# Patient Record
Sex: Female | Born: 1975 | Race: Black or African American | Hispanic: No | Marital: Single | Smoking: Current every day smoker
Health system: Southern US, Community
[De-identification: ages and names within clinical notes are randomized; demographics above are authoritative.]

## PROBLEM LIST (undated history)

## (undated) ENCOUNTER — Inpatient Hospital Stay (HOSPITAL_COMMUNITY): Payer: Self-pay

## (undated) DIAGNOSIS — A549 Gonococcal infection, unspecified: Secondary | ICD-10-CM

## (undated) DIAGNOSIS — IMO0002 Reserved for concepts with insufficient information to code with codable children: Secondary | ICD-10-CM

## (undated) DIAGNOSIS — N390 Urinary tract infection, site not specified: Secondary | ICD-10-CM

## (undated) DIAGNOSIS — A749 Chlamydial infection, unspecified: Secondary | ICD-10-CM

## (undated) HISTORY — PX: THERAPEUTIC ABORTION: SHX798

---

## 1998-04-22 ENCOUNTER — Emergency Department (HOSPITAL_COMMUNITY): Admission: EM | Admit: 1998-04-22 | Discharge: 1998-04-22 | Payer: Self-pay | Admitting: Emergency Medicine

## 1999-03-27 ENCOUNTER — Emergency Department (HOSPITAL_COMMUNITY): Admission: EM | Admit: 1999-03-27 | Discharge: 1999-03-27 | Payer: Self-pay | Admitting: Emergency Medicine

## 1999-05-12 ENCOUNTER — Other Ambulatory Visit: Admission: RE | Admit: 1999-05-12 | Discharge: 1999-05-12 | Payer: Self-pay | Admitting: Obstetrics

## 2002-09-06 ENCOUNTER — Emergency Department (HOSPITAL_COMMUNITY): Admission: EM | Admit: 2002-09-06 | Discharge: 2002-09-06 | Payer: Self-pay | Admitting: Emergency Medicine

## 2007-10-21 ENCOUNTER — Inpatient Hospital Stay (HOSPITAL_COMMUNITY): Admission: AD | Admit: 2007-10-21 | Discharge: 2007-10-21 | Payer: Self-pay | Admitting: Obstetrics and Gynecology

## 2008-07-14 ENCOUNTER — Emergency Department (HOSPITAL_COMMUNITY): Admission: EM | Admit: 2008-07-14 | Discharge: 2008-07-14 | Payer: Self-pay | Admitting: Emergency Medicine

## 2010-01-20 ENCOUNTER — Emergency Department (HOSPITAL_COMMUNITY): Admission: EM | Admit: 2010-01-20 | Discharge: 2010-01-20 | Payer: Self-pay | Admitting: Family Medicine

## 2010-11-14 LAB — POCT URINALYSIS DIP (DEVICE)
Specific Gravity, Urine: 1.025 (ref 1.005–1.030)
pH: 6 (ref 5.0–8.0)

## 2010-11-14 LAB — URINE CULTURE: Colony Count: >=100000

## 2011-05-22 LAB — WET PREP, GENITAL: Yeast Wet Prep HPF POC: NONE SEEN

## 2011-05-22 LAB — BASIC METABOLIC PANEL
GFR calc non Af Amer: >60
Potassium: 3.4 — ABNORMAL LOW
Sodium: 138

## 2011-05-22 LAB — CBC
HCT: 36.8
Hemoglobin: 12.5
WBC: 11.2 — ABNORMAL HIGH

## 2011-05-22 LAB — URINE MICROSCOPIC-ADD ON

## 2011-05-22 LAB — URINALYSIS, ROUTINE W REFLEX MICROSCOPIC
Bilirubin Urine: NEGATIVE
Glucose, UA: 250 — AB
Ketones, ur: 15 — AB
Nitrite: POSITIVE — AB
pH: 5

## 2011-05-22 LAB — DIFFERENTIAL
Eosinophils Relative: 1
Lymphocytes Relative: 26
Lymphs Abs: 2.9
Monocytes Absolute: 0.8
Monocytes Relative: 7

## 2011-05-30 LAB — URINALYSIS, ROUTINE W REFLEX MICROSCOPIC
Glucose, UA: NEGATIVE
Ketones, ur: NEGATIVE
Protein, ur: NEGATIVE

## 2011-05-30 LAB — URINE CULTURE: Colony Count: >=100000

## 2011-05-30 LAB — URINE MICROSCOPIC-ADD ON

## 2011-07-17 ENCOUNTER — Inpatient Hospital Stay (HOSPITAL_COMMUNITY): Payer: Medicaid Other

## 2011-07-17 ENCOUNTER — Inpatient Hospital Stay (HOSPITAL_COMMUNITY)
Admission: AD | Admit: 2011-07-17 | Discharge: 2011-07-17 | Disposition: A | Discharge status: Home / Self Care | Payer: Medicaid Other | Source: Ambulatory Visit | Attending: Obstetrics & Gynecology | Admitting: Obstetrics & Gynecology

## 2011-07-17 ENCOUNTER — Encounter (HOSPITAL_COMMUNITY): Payer: Self-pay | Admitting: *Deleted

## 2011-07-17 DIAGNOSIS — Z3490 Encounter for supervision of normal pregnancy, unspecified, unspecified trimester: Secondary | ICD-10-CM

## 2011-07-17 DIAGNOSIS — Z1389 Encounter for screening for other disorder: Secondary | ICD-10-CM

## 2011-07-17 DIAGNOSIS — R1031 Right lower quadrant pain: Secondary | ICD-10-CM | POA: Insufficient documentation

## 2011-07-17 DIAGNOSIS — A5901 Trichomonal vulvovaginitis: Secondary | ICD-10-CM

## 2011-07-17 DIAGNOSIS — O98819 Other maternal infectious and parasitic diseases complicating pregnancy, unspecified trimester: Secondary | ICD-10-CM | POA: Insufficient documentation

## 2011-07-17 HISTORY — DX: Chlamydial infection, unspecified: A74.9

## 2011-07-17 HISTORY — DX: Reserved for concepts with insufficient information to code with codable children: IMO0002

## 2011-07-17 HISTORY — DX: Gonococcal infection, unspecified: A54.9

## 2011-07-17 HISTORY — DX: Urinary tract infection, site not specified: N39.0

## 2011-07-17 LAB — CBC
Hemoglobin: 12.4 g/dL (ref 12.0–15.0)
RBC: 4.42 MIL/uL (ref 3.87–5.11)
WBC: 10.7 10*3/uL — ABNORMAL HIGH (ref 4.0–10.5)

## 2011-07-17 LAB — URINALYSIS, ROUTINE W REFLEX MICROSCOPIC
Bilirubin Urine: NEGATIVE
Glucose, UA: NEGATIVE mg/dL
Hgb urine dipstick: NEGATIVE
Ketones, ur: NEGATIVE mg/dL
Protein, ur: NEGATIVE mg/dL

## 2011-07-17 LAB — URINE MICROSCOPIC-ADD ON

## 2011-07-17 LAB — ABO/RH: ABO/RH(D): O POS

## 2011-07-17 LAB — WET PREP, GENITAL
Clue Cells Wet Prep HPF POC: NONE SEEN
Yeast Wet Prep HPF POC: NONE SEEN

## 2011-07-17 LAB — HCG, QUANTITATIVE, PREGNANCY: hCG, Beta Chain, Quant, S: 14161 m[IU]/mL — ABNORMAL HIGH (ref <5)

## 2011-07-17 MED ORDER — METRONIDAZOLE 500 MG PO TABS: 500.0000 mg | ORAL_TABLET | Freq: Two times a day (BID) | ORAL | Status: DC

## 2011-07-17 NOTE — Progress Notes (Signed)
Pain in low abd and low back, mainly RLQ- started over weekend.

## 2011-07-17 NOTE — ED Provider Notes (Signed)
History     Chief Complaint  Patient presents with  . Abdominal Pain  . Back Pain   HPICharlette N Simmons is 35 y.o. [id number] unknown weeks presenting with RLQ and low back pain that started 3 days ago. The pain came on suddenly and feels like a cramping. It comes and goes and is worse when she is laying on her right side. She states it is similar to the pain she has had with kidney infections, but denies frequency/urgency. No constipation or diarrhea.  She had a positive home preg test last week. LMP 10/1.  Did not have pain during her other pregnancies.  Denies vaginal bleeding.    Past Medical History  Diagnosis Date  . Urinary tract infection   . Abnormal Pap smear     cryo, rpt wnl  . Chlamydia   . Gonorrhea     Past Surgical History  Procedure Date  . No past surgeries     Family History  Problem Relation Age of Onset  . Anesthesia problems Neg Hx     History  Substance Use Topics  . Smoking status: Current Everyday Smoker -- 0.2 packs/day for 10 years  . Smokeless tobacco: Not on file   Comment: cutting back  . Alcohol Use: No    Allergies: No Known Allergies  Prescriptions prior to admission  Medication Sig Dispense Refill  . ALPRAZolam (XANAX) 0.25 MG tablet Take 0.25 mg by mouth once. For anxiety         Review of Systems  HENT: Negative for congestion and sore throat.   Eyes: Negative for blurred vision and double vision.  Respiratory: Negative for cough.   Gastrointestinal: Negative for nausea, vomiting, abdominal pain, diarrhea and constipation.  Genitourinary: Negative for urgency and frequency.  Musculoskeletal: Positive for back pain.  Neurological: Negative for dizziness and headaches.   Physical Exam   Blood pressure 127/69, pulse 83, temperature 98.7 F (37.1 C), temperature source Oral, resp. rate 16, height 5\' 5"  (1.651 m), weight 157 lb 9.6 oz (71.487 kg), last menstrual period 05/31/2011, SpO2 99.00%.  Physical Exam    Constitutional: She is oriented to person, place, and time. She appears well-developed and well-nourished.  HENT:  Head: Normocephalic and atraumatic.  Nose: Nose normal.  Mouth/Throat: Oropharynx is clear and moist.  Eyes: EOM are normal.  Neck: Normal range of motion.  Cardiovascular: Normal rate, regular rhythm, normal heart sounds and intact distal pulses.   Respiratory: Effort normal and breath sounds normal.  GI: Soft. Bowel sounds are normal. She exhibits no distension. There is tenderness. There is guarding. There is no rebound.       Tender to palpation RLQ and suprapubic. Intraluminal gas palpated in RLQ.  Genitourinary: Vagina normal.       Uterus soft, enlarged.   Musculoskeletal: Normal range of motion. She exhibits no edema and no tenderness.  Neurological: She is alert and oriented to person, place, and time.  Skin: Skin is warm and dry.  Psychiatric: She has a normal mood and affect.   Results for orders placed during the hospital encounter of 07/17/11 (from the past 24 hour(s))  URINALYSIS, ROUTINE W REFLEX MICROSCOPIC     Status: Abnormal   Collection Time   07/17/11  2:35 PM      Component Value Range   Color, Urine YELLOW  YELLOW    Appearance CLEAR  CLEAR    Specific Gravity, Urine >1.030 (*) 1.005 - 1.030    pH 5.5  5.0 -  8.0    Glucose, UA NEGATIVE  NEGATIVE (mg/dL)   Hgb urine dipstick NEGATIVE  NEGATIVE    Bilirubin Urine NEGATIVE  NEGATIVE    Ketones, ur NEGATIVE  NEGATIVE (mg/dL)   Protein, ur NEGATIVE  NEGATIVE (mg/dL)   Urobilinogen, UA 0.2  0.0 - 1.0 (mg/dL)   Nitrite NEGATIVE  NEGATIVE    Leukocytes, UA TRACE (*) NEGATIVE   URINE MICROSCOPIC-ADD ON     Status: Abnormal   Collection Time   07/17/11  2:35 PM      Component Value Range   Squamous Epithelial / LPF FEW (*) RARE    WBC, UA 0-2  <3 (WBC/hpf)   RBC / HPF 0-2  <3 (RBC/hpf)   Bacteria, UA FEW (*) RARE   POCT PREGNANCY, URINE     Status: Normal   Collection Time   07/17/11  2:41 PM       Component Value Range   Preg Test, Ur POSITIVE    CBC     Status: Abnormal   Collection Time   07/17/11  3:59 PM      Component Value Range   WBC 10.7 (*) 4.0 - 10.5 (K/uL)   RBC 4.42  3.87 - 5.11 (MIL/uL)   Hemoglobin 12.4  12.0 - 15.0 (g/dL)   HCT 16.1  09.6 - 04.5 (%)   MCV 83.3  78.0 - 100.0 (fL)   MCH 28.1  26.0 - 34.0 (pg)   MCHC 33.7  30.0 - 36.0 (g/dL)   RDW 40.9  81.1 - 91.4 (%)   Platelets 249  150 - 400 (K/uL)  HCG, QUANTITATIVE, PREGNANCY     Status: Abnormal   Collection Time   07/17/11  3:59 PM      Component Value Range   hCG, Beta Chain, Quant, S 14161 (*) <5 (mIU/mL)  ABO/RH     Status: Normal   Collection Time   07/17/11  3:59 PM      Component Value Range   ABO/RH(D) O POS    WET PREP, GENITAL     Status: Abnormal   Collection Time   07/17/11  4:50 PM      Component Value Range   Yeast, Wet Prep NONE SEEN  NONE SEEN    Trich, Wet Prep FEW (*) NONE SEEN    Clue Cells, Wet Prep NONE SEEN  NONE SEEN    WBC, Wet Prep HPF POC FEW (*) NONE SEEN    ULTRASOUND REPORT--IUP with FHR 130.  [redacted]w[redacted]d.  Sm SCH No FF SM CLC on left.  EDD 03/07/11 MAU Course  Procedures  GC/cHL culture to lab  MDM The HPI, ROS and exam was done by [facility] [place],MD.  I observed her exam and have reviewed her note and plan of care and agree.   Assessment and Plan  A: Trichomonas Vaginitis      IUP [redacted]w[redacted]d with sm Subchorionic Hemorrhage   P Rx Flagyl to pharmacy, pt instructed to have partner treated.  ETOH warning     Encouraged to begin prenatal care with doctor of her choice.  KEY,EVE M 07/17/2011, 4:57 PM   Matt Holmes, NP 07/17/11 1805  Matt Holmes, NP 07/17/11 1809

## 2011-07-17 NOTE — Progress Notes (Signed)
Patient states that she had a positive home pregnancy test last Thursday. Started having lower abdominal and low back pain over the weekend. Has a history of kidney infections.

## 2011-07-18 ENCOUNTER — Other Ambulatory Visit: Payer: Self-pay | Admitting: Physician Assistant

## 2011-07-18 DIAGNOSIS — N760 Acute vaginitis: Secondary | ICD-10-CM

## 2011-07-18 MED ORDER — METRONIDAZOLE 500 MG PO TABS: 500.0000 mg | ORAL_TABLET | Freq: Two times a day (BID) | ORAL | Status: AC

## 2011-08-29 NOTE — L&D Delivery Note (Signed)
Delivery Note At  a viable unspecified sex was delivered via  (Presentation: ;  ).  APGAR: , ; weight .   Placenta status: , .  Cord:  with the following complications: .  Cord pH: not done  Anesthesia:   Episiotomy:  Lacerations:  Suture Repair: 2.0 Est. Blood Loss (mL):   Mom to postpartum.  Baby to nursery-stable.  Melinda Simmons A 02/24/2012, 10:54 PM   Delivery Note At  a viable unspecified sex was delivered via  (Presentation: ;  ).  APGAR: , ; weight .   Placenta status: , .  Cord:  with the following complications: .  Cord pH: not done  Anesthesia:   Episiotomy:  Lacerations:  Suture Repair: 2.0 Est. Blood Loss (mL):   Mom to postpartum.  Baby to nursery-stable.  Melinda Simmons A 02/24/2012, 10:54 PM

## 2011-12-21 LAB — OB RESULTS CONSOLE HIV ANTIBODY (ROUTINE TESTING): HIV: NONREACTIVE

## 2011-12-21 LAB — OB RESULTS CONSOLE RPR: RPR: NONREACTIVE

## 2011-12-21 LAB — OB RESULTS CONSOLE ANTIBODY SCREEN: Antibody Screen: NEGATIVE

## 2012-02-24 ENCOUNTER — Encounter (HOSPITAL_COMMUNITY): Payer: Self-pay | Admitting: Anesthesiology

## 2012-02-24 ENCOUNTER — Encounter (HOSPITAL_COMMUNITY): Payer: Self-pay

## 2012-02-24 ENCOUNTER — Inpatient Hospital Stay (HOSPITAL_COMMUNITY)
Admission: AD | Admit: 2012-02-24 | Discharge: 2012-02-26 | DRG: 775 | Disposition: A | Discharge status: Home / Self Care | Payer: Medicaid Other | Source: Ambulatory Visit | Attending: Obstetrics | Admitting: Obstetrics

## 2012-02-24 ENCOUNTER — Inpatient Hospital Stay (HOSPITAL_COMMUNITY): Payer: Medicaid Other | Admitting: Anesthesiology

## 2012-02-24 DIAGNOSIS — O99892 Other specified diseases and conditions complicating childbirth: Principal | ICD-10-CM | POA: Diagnosis present

## 2012-02-24 DIAGNOSIS — Z22330 Carrier of Group B streptococcus: Secondary | ICD-10-CM

## 2012-02-24 LAB — COMPREHENSIVE METABOLIC PANEL
ALT: 12 U/L (ref 0–35)
AST: 20 U/L (ref 0–37)
Albumin: 2.8 g/dL — ABNORMAL LOW (ref 3.5–5.2)
Alkaline Phosphatase: 189 U/L — ABNORMAL HIGH (ref 39–117)
CO2: 22 mEq/L (ref 19–32)
Chloride: 101 mEq/L (ref 96–112)
GFR calc non Af Amer: >90 mL/min (ref >90)
Potassium: 3.7 mEq/L (ref 3.5–5.1)
Total Bilirubin: 0.2 mg/dL — ABNORMAL LOW (ref 0.3–1.2)

## 2012-02-24 LAB — POCT FERN TEST: Fern Test: POSITIVE

## 2012-02-24 LAB — CBC
Hemoglobin: 11.9 g/dL — ABNORMAL LOW (ref 12.0–15.0)
MCV: 86.4 fL (ref 78.0–100.0)
Platelets: 281 10*3/uL (ref 150–400)
RBC: 4.11 MIL/uL (ref 3.87–5.11)
WBC: 13.9 10*3/uL — ABNORMAL HIGH (ref 4.0–10.5)

## 2012-02-24 MED ORDER — LACTATED RINGERS IV SOLN
INTRAVENOUS | Status: DC
  Administered 2012-02-24 (×2): via INTRAVENOUS

## 2012-02-24 MED ORDER — FENTANYL 2.5 MCG/ML BUPIVACAINE 1/10 % EPIDURAL INFUSION (WH - ANES)
14.0000 mL/h | INTRAMUSCULAR | Status: DC
  Administered 2012-02-24: 14 mL/h via EPIDURAL
  Filled 2012-02-24: qty 60

## 2012-02-24 MED ORDER — PENICILLIN G POTASSIUM 5000000 UNITS IJ SOLR
5.0000 10*6.[IU] | INTRAVENOUS | Status: AC
  Administered 2012-02-24: 5 10*6.[IU] via INTRAVENOUS
  Filled 2012-02-24: qty 5

## 2012-02-24 MED ORDER — BUTORPHANOL TARTRATE 2 MG/ML IJ SOLN
1.0000 mg | INTRAMUSCULAR | Status: DC | PRN
  Administered 2012-02-24: 1 mg via INTRAVENOUS
  Filled 2012-02-24: qty 1

## 2012-02-24 MED ORDER — OXYTOCIN 40 UNITS IN LACTATED RINGERS INFUSION - SIMPLE MED
1.0000 m[IU]/min | INTRAVENOUS | Status: DC
  Administered 2012-02-24: 1 m[IU]/min via INTRAVENOUS
  Filled 2012-02-24: qty 1000

## 2012-02-24 MED ORDER — TERBUTALINE SULFATE 1 MG/ML IJ SOLN: 0.2500 mg | Freq: Once | INTRAMUSCULAR | Status: AC | PRN

## 2012-02-24 MED ORDER — ONDANSETRON HCL 4 MG/2ML IJ SOLN: 4.0000 mg | Freq: Four times a day (QID) | INTRAMUSCULAR | Status: DC | PRN

## 2012-02-24 MED ORDER — CITRIC ACID-SODIUM CITRATE 334-500 MG/5ML PO SOLN: 30.0000 mL | ORAL | Status: DC | PRN

## 2012-02-24 MED ORDER — EPHEDRINE 5 MG/ML INJ
10.0000 mg | INTRAVENOUS | Status: DC | PRN
  Filled 2012-02-24: qty 4

## 2012-02-24 MED ORDER — LIDOCAINE HCL (PF) 1 % IJ SOLN
INTRAMUSCULAR | Status: DC | PRN
  Administered 2012-02-24 (×4): 4 mL

## 2012-02-24 MED ORDER — DIPHENHYDRAMINE HCL 50 MG/ML IJ SOLN: 12.5000 mg | INTRAMUSCULAR | Status: DC | PRN

## 2012-02-24 MED ORDER — FLEET ENEMA 7-19 GM/118ML RE ENEM: 1.0000 | ENEMA | RECTAL | Status: DC | PRN

## 2012-02-24 MED ORDER — OXYTOCIN BOLUS FROM INFUSION
250.0000 mL | Freq: Once | INTRAVENOUS | Status: DC
  Filled 2012-02-24: qty 500

## 2012-02-24 MED ORDER — OXYTOCIN 40 UNITS IN LACTATED RINGERS INFUSION - SIMPLE MED
62.5000 mL/h | Freq: Once | INTRAVENOUS | Status: AC
  Administered 2012-02-24: 62.5 mL/h via INTRAVENOUS

## 2012-02-24 MED ORDER — IBUPROFEN 600 MG PO TABS: 600.0000 mg | ORAL_TABLET | Freq: Four times a day (QID) | ORAL | Status: DC | PRN

## 2012-02-24 MED ORDER — ACETAMINOPHEN 325 MG PO TABS: 650.0000 mg | ORAL_TABLET | ORAL | Status: DC | PRN

## 2012-02-24 MED ORDER — OXYCODONE-ACETAMINOPHEN 5-325 MG PO TABS: 1.0000 | ORAL_TABLET | ORAL | Status: DC | PRN

## 2012-02-24 MED ORDER — LACTATED RINGERS IV SOLN
500.0000 mL | INTRAVENOUS | Status: DC | PRN
  Administered 2012-02-24: 500 mL via INTRAVENOUS

## 2012-02-24 MED ORDER — LIDOCAINE HCL (PF) 1 % IJ SOLN: 30.0000 mL | INTRAMUSCULAR | Status: DC | PRN

## 2012-02-24 MED ORDER — LACTATED RINGERS IV SOLN
500.0000 mL | Freq: Once | INTRAVENOUS | Status: AC
  Administered 2012-02-24: 500 mL via INTRAVENOUS

## 2012-02-24 MED ORDER — EPHEDRINE 5 MG/ML INJ: 10.0000 mg | INTRAVENOUS | Status: DC | PRN

## 2012-02-24 MED ORDER — PHENYLEPHRINE 40 MCG/ML (10ML) SYRINGE FOR IV PUSH (FOR BLOOD PRESSURE SUPPORT): 80.0000 ug | PREFILLED_SYRINGE | INTRAVENOUS | Status: DC | PRN

## 2012-02-24 MED ORDER — PHENYLEPHRINE 40 MCG/ML (10ML) SYRINGE FOR IV PUSH (FOR BLOOD PRESSURE SUPPORT)
80.0000 ug | PREFILLED_SYRINGE | INTRAVENOUS | Status: DC | PRN
  Filled 2012-02-24: qty 5

## 2012-02-24 MED ORDER — PENICILLIN G POTASSIUM 5000000 UNITS IJ SOLR
2.5000 10*6.[IU] | INTRAVENOUS | Status: DC
  Administered 2012-02-24 (×3): 2.5 10*6.[IU] via INTRAVENOUS
  Filled 2012-02-24 (×7): qty 2.5

## 2012-02-24 NOTE — Anesthesia Preprocedure Evaluation (Signed)
Anesthesia Evaluation  Patient identified by MRN, date of birth, ID band Patient awake    Reviewed: Allergy & Precautions, H&P , NPO status , Patient's Chart, lab work & pertinent test results, reviewed documented beta blocker date and time   History of Anesthesia Complications Negative for: history of anesthetic complications  Airway Mallampati: II TM Distance: >3 FB Neck ROM: full    Dental  (+) Teeth Intact   Pulmonary Current Smoker,  breath sounds clear to auscultation        Cardiovascular negative cardio ROS  Rhythm:regular Rate:Normal     Neuro/Psych negative neurological ROS  negative psych ROS   GI/Hepatic negative GI ROS, (+)     substance abuse  marijuana use,   Endo/Other  negative endocrine ROS  Renal/GU negative Renal ROS     Musculoskeletal   Abdominal   Peds  Hematology negative hematology ROS (+)   Anesthesia Other Findings   Reproductive/Obstetrics (+) Pregnancy                           Anesthesia Physical Anesthesia Plan  ASA: II  Anesthesia Plan: Epidural   Post-op Pain Management:    Induction:   Airway Management Planned:   Additional Equipment:   Intra-op Plan:   Post-operative Plan:   Informed Consent: I have reviewed the patients History and Physical, chart, labs and discussed the procedure including the risks, benefits and alternatives for the proposed anesthesia with the patient or authorized representative who has indicated his/her understanding and acceptance.     Plan Discussed with:   Anesthesia Plan Comments:         Anesthesia Quick Evaluation

## 2012-02-24 NOTE — Anesthesia Procedure Notes (Signed)
Epidural Patient location during procedure: OB Start time: 02/24/2012 9:27 PM Reason for block: procedure for pain  Staffing Performed by: anesthesiologist   Preanesthetic Checklist Completed: patient identified, site marked, surgical consent, pre-op evaluation, timeout performed, IV checked, risks and benefits discussed and monitors and equipment checked  Epidural Patient position: sitting Prep: site prepped and draped and DuraPrep Patient monitoring: continuous pulse ox and blood pressure Approach: midline Injection technique: LOR air  Needle:  Needle type: Tuohy  Needle gauge: 17 G Needle length: 9 cm Needle insertion depth: 6 cm Catheter type: closed end flexible Catheter size: 19 Gauge Catheter at skin depth: 11 cm Test dose: negative  Assessment Events: blood not aspirated, injection not painful, no injection resistance, negative IV test and no paresthesia  Additional Notes Discussed risk of headache, infection, bleeding, nerve injury and failed or incomplete block.  Patient voices understanding and wishes to proceed.

## 2012-02-24 NOTE — MAU Note (Signed)
Patient states she started leaking clear fluid with a little pink tinge at 0600. States some mild cramping. Reports good fetal movement.

## 2012-02-24 NOTE — H&P (Signed)
This is Dr. Francoise Ceo dictating the history and physical on  Melinda Simmons she's a 36 year old gravida 3 para 2 all to [facility] 03/06/2012 she's a him 38+ weeks positive GBS she received penicillin her membranes ruptured at 6 AM today  And the fluid clear she is now on low-dose Pitocin cervix 3 cm and 70% with the vertex at a -1 station and IUPC was inserted and the tracing is reactive Past medical history negative Past surgical history negative System review noncontributory Physical exam well-developed female in labor with ruptured membranes HEENT negative Lungs clear Breasts negative Heart regular rhythm no murmurs no gallops Abdomen term Pelvic as described above Extremities negative

## 2012-02-25 ENCOUNTER — Encounter (HOSPITAL_COMMUNITY): Payer: Self-pay | Admitting: Obstetrics

## 2012-02-25 LAB — CBC
MCH: 28.5 pg (ref 26.0–34.0)
MCHC: 32.9 g/dL (ref 30.0–36.0)
Platelets: 233 10*3/uL (ref 150–400)

## 2012-02-25 MED ORDER — ZOLPIDEM TARTRATE 5 MG PO TABS: 5.0000 mg | ORAL_TABLET | Freq: Every evening | ORAL | Status: DC | PRN

## 2012-02-25 MED ORDER — IBUPROFEN 600 MG PO TABS: 600.0000 mg | ORAL_TABLET | Freq: Four times a day (QID) | ORAL | Status: DC

## 2012-02-25 MED ORDER — TETANUS-DIPHTH-ACELL PERTUSSIS 5-2.5-18.5 LF-MCG/0.5 IM SUSP
0.5000 mL | Freq: Once | INTRAMUSCULAR | Status: AC
  Administered 2012-02-25: 0.5 mL via INTRAMUSCULAR
  Filled 2012-02-25: qty 0.5

## 2012-02-25 MED ORDER — FERROUS SULFATE 325 (65 FE) MG PO TABS
325.0000 mg | ORAL_TABLET | Freq: Two times a day (BID) | ORAL | Status: DC
  Administered 2012-02-25 – 2012-02-26 (×3): 325 mg via ORAL
  Filled 2012-02-25 (×3): qty 1

## 2012-02-25 MED ORDER — PRENATAL MULTIVITAMIN CH
1.0000 | ORAL_TABLET | Freq: Every day | ORAL | Status: DC
  Administered 2012-02-25 – 2012-02-26 (×2): 1 via ORAL
  Filled 2012-02-25 (×2): qty 1

## 2012-02-25 MED ORDER — ONDANSETRON HCL 4 MG/2ML IJ SOLN: 4.0000 mg | INTRAMUSCULAR | Status: DC | PRN

## 2012-02-25 MED ORDER — DIBUCAINE 1 % RE OINT: 1.0000 "application " | TOPICAL_OINTMENT | RECTAL | Status: DC | PRN

## 2012-02-25 MED ORDER — OXYCODONE-ACETAMINOPHEN 5-325 MG PO TABS
1.0000 | ORAL_TABLET | ORAL | Status: DC | PRN
  Administered 2012-02-25: 2 via ORAL
  Administered 2012-02-25: 1 via ORAL
  Administered 2012-02-25: 2 via ORAL
  Administered 2012-02-26: 1 via ORAL
  Filled 2012-02-25 (×2): qty 1
  Filled 2012-02-25 (×2): qty 2

## 2012-02-25 MED ORDER — WITCH HAZEL-GLYCERIN EX PADS: 1.0000 "application " | MEDICATED_PAD | CUTANEOUS | Status: DC | PRN

## 2012-02-25 MED ORDER — LANOLIN HYDROUS EX OINT: TOPICAL_OINTMENT | CUTANEOUS | Status: DC | PRN

## 2012-02-25 MED ORDER — DIPHENHYDRAMINE HCL 25 MG PO CAPS: 25.0000 mg | ORAL_CAPSULE | Freq: Four times a day (QID) | ORAL | Status: DC | PRN

## 2012-02-25 MED ORDER — SIMETHICONE 80 MG PO CHEW: 80.0000 mg | CHEWABLE_TABLET | ORAL | Status: DC | PRN

## 2012-02-25 MED ORDER — ONDANSETRON HCL 4 MG PO TABS: 4.0000 mg | ORAL_TABLET | ORAL | Status: DC | PRN

## 2012-02-25 MED ORDER — SENNOSIDES-DOCUSATE SODIUM 8.6-50 MG PO TABS
2.0000 | ORAL_TABLET | Freq: Every day | ORAL | Status: DC
  Administered 2012-02-25: 2 via ORAL

## 2012-02-25 MED ORDER — BENZOCAINE-MENTHOL 20-0.5 % EX AERO: 1.0000 "application " | INHALATION_SPRAY | CUTANEOUS | Status: DC | PRN

## 2012-02-25 NOTE — Progress Notes (Signed)
Patient ID: Melinda Simmons, female   DOB: [birth year], 36 y.o.   MRN: [id number] Postpartum day one Vital signs normal Fundus firm Lochia moderate Legs negative No complaints doing well

## 2012-02-25 NOTE — Anesthesia Postprocedure Evaluation (Signed)
  Anesthesia Post-op Note  Patient: Melinda Simmons  Procedure(s) Performed: * No procedures listed *  Patient Location: Mother/Baby  Anesthesia Type: Epidural  Level of Consciousness: awake, alert  and oriented  Airway and Oxygen Therapy: Patient Spontanous Breathing  Post-op Pain: mild  Post-op Assessment: Patient's Cardiovascular Status Stable and Respiratory Function Stable  Post-op Vital Signs: stable  Complications: No apparent anesthesia complications

## 2012-02-26 MED ORDER — PNEUMOCOCCAL VAC POLYVALENT 25 MCG/0.5ML IJ INJ
0.5000 mL | INJECTION | INTRAMUSCULAR | Status: AC
  Administered 2012-02-26: 0.5 mL via INTRAMUSCULAR
  Filled 2012-02-26: qty 0.5

## 2012-02-26 NOTE — Progress Notes (Signed)
Ur chart review completed.  

## 2012-02-26 NOTE — Discharge Summary (Signed)
Obstetric Discharge Summary Reason for Admission: induction of labor Prenatal Procedures: none Intrapartum Procedures: spontaneous vaginal delivery Postpartum Procedures: none Complications-Operative and Postpartum: none Hemoglobin  Date Value Range Status  02/25/2012 10.3* 12.0 - 15.0 g/dL Final     HCT  Date Value Range Status  02/25/2012 31.3* 36.0 - 46.0 % Final    Physical Exam:  General: alert Lochia: appropriate Uterine Fundus: firm Incision: healing well DVT Evaluation: No evidence of DVT seen on physical exam.  Discharge Diagnoses: Term Pregnancy-delivered  Discharge Information: Date: 02/26/2012 Activity: pelvic rest Diet: routine Medications: Percocet Condition: stable Instructions: refer to practice specific booklet Discharge to: home Follow-up Information    Follow up with Rosealee Recinos A, MD. Call in 6 weeks.   Contact information:   [address] [place] [address] [phone]          Newborn Data: Live born female  Birth Weight: 6 lb 3.5 oz (2821 g) APGAR: 8, 9  Home with mother.  Twilla Khouri A 02/26/2012, 6:36 AM

## 2012-02-27 NOTE — Progress Notes (Signed)
Pt discharged before Sw could assess history of anxiety and MJ use. Sw will monitor drug screen results and make a referral if needed.

## 2012-07-18 ENCOUNTER — Inpatient Hospital Stay (HOSPITAL_COMMUNITY)
Admission: AD | Admit: 2012-07-18 | Discharge: 2012-07-18 | Disposition: A | Discharge status: Home / Self Care | Payer: Medicaid Other | Source: Ambulatory Visit | Attending: Obstetrics | Admitting: Obstetrics

## 2012-07-18 ENCOUNTER — Encounter (HOSPITAL_COMMUNITY): Payer: Self-pay | Admitting: *Deleted

## 2012-07-18 DIAGNOSIS — R1033 Periumbilical pain: Secondary | ICD-10-CM

## 2012-07-18 DIAGNOSIS — O99891 Other specified diseases and conditions complicating pregnancy: Secondary | ICD-10-CM

## 2012-07-18 DIAGNOSIS — K429 Umbilical hernia without obstruction or gangrene: Secondary | ICD-10-CM

## 2012-07-18 DIAGNOSIS — Z3490 Encounter for supervision of normal pregnancy, unspecified, unspecified trimester: Secondary | ICD-10-CM

## 2012-07-18 LAB — URINALYSIS, ROUTINE W REFLEX MICROSCOPIC
Ketones, ur: NEGATIVE mg/dL
Leukocytes, UA: NEGATIVE
Nitrite: NEGATIVE
Protein, ur: NEGATIVE mg/dL
Urobilinogen, UA: 0.2 mg/dL (ref 0.0–1.0)

## 2012-07-18 NOTE — MAU Provider Note (Signed)
  History     CSN: [id number]  Arrival date and time: 07/18/12 2130   First Provider Initiated Contact with Patient 07/18/12 2139      Chief Complaint  Patient presents with  . Abdominal Pain   HPI This is a 36 y.o. female at [redacted]w[redacted]d who presents with c/o pain in umbilicus for 2 days. States has had a hernia for a while but it usually doesn't hurt. Denies lower pelvic pain. Just had a baby 4 months ago.  Does not want to be pregnant, and states has an appt for an abortion next week. Denies bleeding.  OB History    Grav Para Term Preterm Abortions TAB SAB Ect Mult Living   4 3 3  0 0 0 0 0 0 3      Past Medical History  Diagnosis Date  . Urinary tract infection   . Abnormal Pap smear     cryo, rpt wnl  . Chlamydia   . Gonorrhea     Past Surgical History  Procedure Date  . No past surgeries     Family History  Problem Relation Age of Onset  . Anesthesia problems Neg Hx     History  Substance Use Topics  . Smoking status: Current Every Day Smoker -- 0.2 packs/day for 10 years  . Smokeless tobacco: Not on file     Comment: cutting back  . Alcohol Use: No    Allergies:  Allergies  Allergen Reactions  . Motrin (Ibuprofen) Other (See Comments)    Causes Bleeding.    No prescriptions prior to admission    ROS See HPI  Physical Exam   Blood pressure 136/81, pulse 75, temperature 98.7 F (37.1 C), temperature source Oral, resp. rate 20, height 5\' 4"  (1.626 m), weight 182 lb (82.555 kg), last menstrual period 05/21/2012, SpO2 100.00%.  Physical Exam  Constitutional: She is oriented to person, place, and time. She appears well-developed and well-nourished. No distress.  Cardiovascular: Normal rate.   Respiratory: Effort normal.  GI: Soft. She exhibits no distension and no mass. There is tenderness (over umbilicus.  Quarter sized hernia noted with protrusion of bowel, reduced easily with improvement in pain). There is no rebound and no guarding.    Musculoskeletal: Normal range of motion.  Neurological: She is alert and oriented to person, place, and time.  Skin: Skin is warm and dry.  Psychiatric: She has a normal mood and affect.    MAU Course  Procedures  MDM Discussed with Dr Clearance Coots. No need for quants or pelvic exam. Refer to Dr Gaynell Face to arrange surgical consult  Assessment and Plan  A:  Pregnancy      Umbilical hernia, reduceable  P:  Discharge home       Discussed hernia. Advised how to reduce protrusion and to go to ER if cannot reduce it.       Call Dr Gaynell Face in AM to get surgical referral  [facility] 07/18/2012, 9:55 PM

## 2012-07-18 NOTE — MAU Note (Signed)
Pt reports she had a positive preg test 4 days ago, states she has been having "a lot of pain" in her umbilicus area. States it hurts esp when she trys to pick up her 62 month old. States it feels swollen. LMP 05/21/2012

## 2012-07-19 ENCOUNTER — Encounter (HOSPITAL_COMMUNITY): Payer: Self-pay | Admitting: Advanced Practice Midwife

## 2013-05-23 ENCOUNTER — Encounter (HOSPITAL_COMMUNITY): Payer: Self-pay | Admitting: *Deleted

## 2014-06-29 ENCOUNTER — Encounter (HOSPITAL_COMMUNITY): Payer: Self-pay | Admitting: *Deleted

## 2017-07-02 ENCOUNTER — Ambulatory Visit (INDEPENDENT_AMBULATORY_CARE_PROVIDER_SITE_OTHER): Payer: BLUE CROSS/BLUE SHIELD | Admitting: General Practice

## 2017-07-02 ENCOUNTER — Encounter: Payer: Self-pay | Admitting: General Practice

## 2017-07-02 DIAGNOSIS — Z3201 Encounter for pregnancy test, result positive: Secondary | ICD-10-CM | POA: Diagnosis not present

## 2017-07-02 LAB — POCT PREGNANCY, URINE: PREG TEST UR: POSITIVE — AB

## 2017-07-02 NOTE — Progress Notes (Signed)
Patient here for UPT today, UPT +. Patient reports first positive home test 2 weeks ago. LMP 05/07/17 EDD 02/11/18 8 weeks today. Patient denies taking any meds/vitamins. Recommended she begin PNV and OB care. 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 Discussed with patient that those feelings can be common in early pregnancy and would warrant concern if she develops severe pain or bleeding like a period. Work restrictions letter given to patient. Patient verbalized understanding & had no other questions

## 2017-07-23 ENCOUNTER — Encounter: Payer: BLUE CROSS/BLUE SHIELD | Admitting: Advanced Practice Midwife

## 2017-08-01 ENCOUNTER — Inpatient Hospital Stay (HOSPITAL_COMMUNITY)
Admission: AD | Admit: 2017-08-01 | Discharge: 2017-08-01 | Disposition: A | Discharge status: Home / Self Care | Payer: BLUE CROSS/BLUE SHIELD | Source: Ambulatory Visit | Attending: Family Medicine | Admitting: Family Medicine

## 2017-08-01 ENCOUNTER — Encounter (HOSPITAL_COMMUNITY): Payer: Self-pay | Admitting: *Deleted

## 2017-08-01 DIAGNOSIS — Z3A12 12 weeks gestation of pregnancy: Secondary | ICD-10-CM | POA: Diagnosis not present

## 2017-08-01 DIAGNOSIS — R109 Unspecified abdominal pain: Secondary | ICD-10-CM

## 2017-08-01 DIAGNOSIS — O26891 Other specified pregnancy related conditions, first trimester: Secondary | ICD-10-CM | POA: Diagnosis not present

## 2017-08-01 DIAGNOSIS — N760 Acute vaginitis: Secondary | ICD-10-CM | POA: Insufficient documentation

## 2017-08-01 DIAGNOSIS — O23591 Infection of other part of genital tract in pregnancy, first trimester: Secondary | ICD-10-CM | POA: Diagnosis not present

## 2017-08-01 DIAGNOSIS — O99331 Smoking (tobacco) complicating pregnancy, first trimester: Secondary | ICD-10-CM | POA: Diagnosis not present

## 2017-08-01 DIAGNOSIS — F17210 Nicotine dependence, cigarettes, uncomplicated: Secondary | ICD-10-CM | POA: Insufficient documentation

## 2017-08-01 DIAGNOSIS — Z3491 Encounter for supervision of normal pregnancy, unspecified, first trimester: Secondary | ICD-10-CM

## 2017-08-01 DIAGNOSIS — B9689 Other specified bacterial agents as the cause of diseases classified elsewhere: Secondary | ICD-10-CM | POA: Insufficient documentation

## 2017-08-01 LAB — WET PREP, GENITAL
SPERM: NONE SEEN
TRICH WET PREP: NONE SEEN
Yeast Wet Prep HPF POC: NONE SEEN

## 2017-08-01 LAB — URINALYSIS, ROUTINE W REFLEX MICROSCOPIC
Bilirubin Urine: NEGATIVE
Glucose, UA: NEGATIVE mg/dL
Hgb urine dipstick: NEGATIVE
Ketones, ur: NEGATIVE mg/dL
LEUKOCYTES UA: NEGATIVE
NITRITE: NEGATIVE
Protein, ur: NEGATIVE mg/dL
SPECIFIC GRAVITY, URINE: 1.017 (ref 1.005–1.030)
pH: 6 (ref 5.0–8.0)

## 2017-08-01 LAB — CBC
HEMATOCRIT: 36.1 % (ref 36.0–46.0)
HEMOGLOBIN: 11.8 g/dL — AB (ref 12.0–15.0)
MCH: 27.9 pg (ref 26.0–34.0)
MCHC: 32.7 g/dL (ref 30.0–36.0)
MCV: 85.3 fL (ref 78.0–100.0)
Platelets: 258 10*3/uL (ref 150–400)
RBC: 4.23 MIL/uL (ref 3.87–5.11)
RDW: 14.5 % (ref 11.5–15.5)
WBC: 9.2 10*3/uL (ref 4.0–10.5)

## 2017-08-01 MED ORDER — METRONIDAZOLE 500 MG PO TABS: 500.0000 mg | ORAL_TABLET | Freq: Two times a day (BID) | ORAL | 0 refills | Status: DC

## 2017-08-01 NOTE — Discharge Instructions (Signed)
Bacterial Vaginosis Bacterial vaginosis is a vaginal infection that occurs when the normal balance of bacteria in the vagina is disrupted. It results from an overgrowth of certain bacteria. This is the most common vaginal infection among women ages 15-44. Because bacterial vaginosis increases your risk for STIs (sexually transmitted infections), getting treated can help reduce your risk for chlamydia, gonorrhea, herpes, and HIV (human immunodeficiency virus). Treatment is also important for preventing complications in pregnant women, because this condition can cause an early (premature) delivery. What are the causes? This condition is caused by an increase in harmful bacteria that are normally present in small amounts in the vagina. However, the reason that the condition develops is not fully understood. What increases the risk? The following factors may make you more likely to develop this condition:  Having a new sexual partner or multiple sexual partners.  Having unprotected sex.  Douching.  Having an intrauterine device (IUD).  Smoking.  Drug and alcohol abuse.  Taking certain antibiotic medicines.  Being pregnant.  You cannot get bacterial vaginosis from toilet seats, bedding, swimming pools, or contact with objects around you. What are the signs or symptoms? Symptoms of this condition include:  Grey or white vaginal discharge. The discharge can also be watery or foamy.  A fish-like odor with discharge, especially after sexual intercourse or during menstruation.  Itching in and around the vagina.  Burning or pain with urination.  Some women with bacterial vaginosis have no signs or symptoms. How is this diagnosed? This condition is diagnosed based on:  Your medical history.  A physical exam of the vagina.  Testing a sample of vaginal fluid under a microscope to look for a large amount of bad bacteria or abnormal cells. Your health care provider may use a cotton swab  or a small wooden spatula to collect the sample.  How is this treated? This condition is treated with antibiotics. These may be given as a pill, a vaginal cream, or a medicine that is put into the vagina (suppository). If the condition comes back after treatment, a second round of antibiotics may be needed. Follow these instructions at home: Medicines  Take over-the-counter and prescription medicines only as told by your health care provider.  Take or use your antibiotic as told by your health care provider. Do not stop taking or using the antibiotic even if you start to feel better. General instructions  If you have a female sexual partner, tell her that you have a vaginal infection. She should see her health care provider and be treated if she has symptoms. If you have a female sexual partner, he does not need treatment.  During treatment: ? Avoid sexual activity until you finish treatment. ? Do not douche. ? Avoid alcohol as directed by your health care provider. ? Avoid breastfeeding as directed by your health care provider.  Drink enough water and fluids to keep your urine clear or pale yellow.  Keep the area around your vagina and rectum clean. ? Wash the area daily with warm water. ? Wipe yourself from front to back after using the toilet.  Keep all follow-up visits as told by your health care provider. This is important. How is this prevented?  Do not douche.  Wash the outside of your vagina with warm water only.  Use protection when having sex. This includes latex condoms and dental dams.  Limit how many sexual partners you have. To help prevent bacterial vaginosis, it is best to have sex with just   one partner (monogamous).  Make sure you and your sexual partner are tested for STIs.  Wear cotton or cotton-lined underwear.  Avoid wearing tight pants and pantyhose, especially during summer.  Limit the amount of alcohol that you drink.  Do not use any products that  contain nicotine or tobacco, such as cigarettes and e-cigarettes. If you need help quitting, ask your health care provider.  Do not use illegal drugs. Where to find more information:  Centers for Disease Control and Prevention: www.cdc.gov/std  American Sexual Health Association (ASHA): www.ashastd.org  U.S. Department of Health and Human Services, Office on Women's Health: www.womenshealth.gov/ or https://www.womenshealth.gov/a-z-topics/bacterial-vaginosis Contact a health care provider if:  Your symptoms do not improve, even after treatment.  You have more discharge or pain when urinating.  You have a fever.  You have pain in your abdomen.  You have pain during sex.  You have vaginal bleeding between periods. Summary  Bacterial vaginosis is a vaginal infection that occurs when the normal balance of bacteria in the vagina is disrupted.  Because bacterial vaginosis increases your risk for STIs (sexually transmitted infections), getting treated can help reduce your risk for chlamydia, gonorrhea, herpes, and HIV (human immunodeficiency virus). Treatment is also important for preventing complications in pregnant women, because the condition can cause an early (premature) delivery.  This condition is treated with antibiotic medicines. These may be given as a pill, a vaginal cream, or a medicine that is put into the vagina (suppository). This information is not intended to replace advice given to you by your health care provider. Make sure you discuss any questions you have with your health care provider. Document Released: 08/14/2005 Document Revised: 04/29/2016 Document Reviewed: 04/29/2016 Elsevier Interactive Patient Education  2017 [employer]  

## 2017-08-01 NOTE — MAU Note (Signed)
Pt reports pain in right groin, right lower abd. Spotting Sunday and Monday.

## 2017-08-01 NOTE — MAU Provider Note (Signed)
History     CSN: [id number]  Arrival date and time: 08/01/17 1021   First Provider Initiated Contact with Patient 08/01/17 1111      Chief Complaint  Patient presents with  . Abdominal Pain  . Vaginal Bleeding   HPI  Melinda Simmons is a 41 y.o. [id number] at [redacted]w[redacted]d who presents with abdominal pain & vaginal bleeding. Reports pink spotting on toilet paper over the weekend. Bleeding has not continued. Has noticed increase in foul smelling discharge.  RLQ pain x 2 weeks. Rates pain 7/10. Pain is intermittent. Hasn't treated symptoms. Lifting things at work makes pain worse.  Denies n/v/d, dysuria, vaginal irritation.   OB History    Gravida Para Term Preterm AB Living   6 3 3  0 2 3   SAB TAB Ectopic Multiple Live Births   0 2 0 0 2      Past Medical History:  Diagnosis Date  . Abnormal Pap smear    cryo, rpt wnl  . Chlamydia   . Gonorrhea   . Urinary tract infection     Past Surgical History:  Procedure Laterality Date  . THERAPEUTIC ABORTION      Family History  Problem Relation Age of Onset  . Anesthesia problems Neg Hx     Social History   Tobacco Use  . Smoking status: Current Every Day Smoker    Packs/day: 0.25    Years: 10.00    Pack years: 2.50    Types: Cigarettes  . Smokeless tobacco: Never Used  . Tobacco comment: cutting back  Substance Use Topics  . Alcohol use: No  . 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 Motrin [Ibuprofen] Other (See Comments)    Causes Bleeding.    No medications prior to admission.    Review of Systems  Constitutional: Negative.   Gastrointestinal: Positive for abdominal pain. Negative for constipation, diarrhea, nausea and vomiting.  Genitourinary: Positive for vaginal bleeding and vaginal discharge. Negative for dysuria.   Physical Exam   Blood pressure 121/75, pulse 92, temperature 98.3 F (36.8 C), temperature source  Oral, resp. rate 16, height 5' 4.5" (1.638 m), weight 151 lb (68.5 kg), last menstrual period 05/06/2017, SpO2 100 %, unknown if currently breastfeeding.  Physical Exam  Nursing note and vitals reviewed. Constitutional: She is oriented to person, place, and time. She appears well-developed and well-nourished. No distress.  HENT:  Head: Normocephalic and atraumatic.  Eyes: Conjunctivae are normal. Right eye exhibits no discharge. Left eye exhibits no discharge. No scleral icterus.  Neck: Normal range of motion.  Respiratory: Effort normal. No respiratory distress.  GI: Soft. There is no tenderness.  Genitourinary: Cervix exhibits no motion tenderness and no friability. No bleeding in the vagina. Vaginal discharge (small amount of thin yellow foul smelling discharge) found.  Genitourinary Comments: Cervix closed  Neurological: She is alert and oriented to person, place, and time.  Skin: Skin is warm and dry. She is not diaphoretic.  Psychiatric: She has a normal mood and affect. Her behavior is normal. Judgment and thought content normal.    MAU Course  Procedures Results for orders placed or performed during the hospital encounter of 08/01/17 (from the past 24 hour(s))  Urinalysis, Routine w reflex microscopic     Status: None   Collection Time: 08/01/17 10:45 AM  Result Value Ref Range   Color, Urine YELLOW  YELLOW   APPearance CLEAR CLEAR   Specific Gravity, Urine 1.017 1.005 - 1.030   pH 6.0 5.0 - 8.0   Glucose, UA NEGATIVE NEGATIVE mg/dL   Hgb urine dipstick NEGATIVE NEGATIVE   Bilirubin Urine NEGATIVE NEGATIVE   Ketones, ur NEGATIVE NEGATIVE mg/dL   Protein, ur NEGATIVE NEGATIVE mg/dL   Nitrite NEGATIVE NEGATIVE   Leukocytes, UA NEGATIVE NEGATIVE  CBC     Status: Abnormal   Collection Time: 08/01/17 11:26 AM  Result Value Ref Range   WBC 9.2 4.0 - 10.5 K/uL   RBC 4.23 3.87 - 5.11 MIL/uL   Hemoglobin 11.8 (L) 12.0 - 15.0 g/dL   HCT 40.936.1 81.136.0 - 91.446.0 %   MCV 85.3 78.0 -  100.0 fL   MCH 27.9 26.0 - 34.0 pg   MCHC 32.7 30.0 - 36.0 g/dL   RDW 78.214.5 95.611.5 - 21.315.5 %   Platelets 258 150 - 400 K/uL  Wet prep, genital     Status: Abnormal   Collection Time: 08/01/17 11:46 AM  Result Value Ref Range   Yeast Wet Prep HPF POC NONE SEEN NONE SEEN   Trich, Wet Prep NONE SEEN NONE SEEN   Clue Cells Wet Prep HPF POC PRESENT (A) NONE SEEN   WBC, Wet Prep HPF POC MANY (A) NONE SEEN   Sperm NONE SEEN     MDM FHT 158 GC/CT & wet prep collected No blood on exam & cervix closed Abdominal pain improved in MAU without intervention  Assessment and Plan  A: 1. Abdominal pain during pregnancy in first trimester   2. Fetal heart tones present, first trimester   3. [redacted] weeks gestation of pregnancy   4. BV (bacterial vaginosis)    P: Discharge home Rx flagyl Discussed reasons to return to MAU Keep follow up appointment with OB/PCP  GC/CT pending   Melinda Simmons 08/01/2017, 11:12 AM

## 2017-08-02 LAB — GC/CHLAMYDIA PROBE AMP (~~LOC~~) NOT AT ARMC
CHLAMYDIA, DNA PROBE: NEGATIVE
NEISSERIA GONORRHEA: NEGATIVE

## 2017-08-10 ENCOUNTER — Ambulatory Visit (INDEPENDENT_AMBULATORY_CARE_PROVIDER_SITE_OTHER): Payer: BLUE CROSS/BLUE SHIELD | Admitting: Obstetrics & Gynecology

## 2017-08-10 ENCOUNTER — Encounter: Payer: Self-pay | Admitting: Obstetrics & Gynecology

## 2017-08-10 VITALS — BP 121/75 | HR 81 | Wt 153.7 lb

## 2017-08-10 DIAGNOSIS — Z1151 Encounter for screening for human papillomavirus (HPV): Secondary | ICD-10-CM

## 2017-08-10 DIAGNOSIS — Z124 Encounter for screening for malignant neoplasm of cervix: Secondary | ICD-10-CM | POA: Diagnosis not present

## 2017-08-10 DIAGNOSIS — O09521 Supervision of elderly multigravida, first trimester: Secondary | ICD-10-CM

## 2017-08-10 DIAGNOSIS — O09529 Supervision of elderly multigravida, unspecified trimester: Secondary | ICD-10-CM | POA: Insufficient documentation

## 2017-08-10 DIAGNOSIS — F32A Depression, unspecified: Secondary | ICD-10-CM

## 2017-08-10 DIAGNOSIS — O99341 Other mental disorders complicating pregnancy, first trimester: Secondary | ICD-10-CM

## 2017-08-10 DIAGNOSIS — F329 Major depressive disorder, single episode, unspecified: Secondary | ICD-10-CM

## 2017-08-10 DIAGNOSIS — O99340 Other mental disorders complicating pregnancy, unspecified trimester: Principal | ICD-10-CM

## 2017-08-10 DIAGNOSIS — Z3480 Encounter for supervision of other normal pregnancy, unspecified trimester: Secondary | ICD-10-CM | POA: Insufficient documentation

## 2017-08-10 LAB — POCT URINALYSIS DIP (DEVICE)
BILIRUBIN URINE: NEGATIVE
Glucose, UA: NEGATIVE mg/dL
HGB URINE DIPSTICK: NEGATIVE
KETONES UR: NEGATIVE mg/dL
Nitrite: NEGATIVE
PH: 7 (ref 5.0–8.0)
PROTEIN: NEGATIVE mg/dL
SPECIFIC GRAVITY, URINE: 1.02 (ref 1.005–1.030)
Urobilinogen, UA: 0.2 mg/dL (ref 0.0–1.0)

## 2017-08-10 NOTE — Progress Notes (Signed)
Subjective:    Melinda Simmons is a [id number] 227w5d being seen today for her first obstetrical visit.  Her obstetrical history is significant for advanced maternal age. Patient does not intend to breast feed. Pregnancy history fully reviewed.  Patient reports fatigue.  Vitals:   08/10/17 0921  BP: 121/75  Pulse: 81  Weight: 153 lb 11.2 oz (69.7 kg)    HISTORY: OB History  Gravida Para Term Preterm AB Living  6 3 3  0 2 3  SAB TAB Ectopic Multiple Live Births  0 2 0 0 3    # Outcome Date GA Lbr Len/2nd Weight Sex Delivery Anes PTL Lv  6 Current           5 TAB 2016          4 TAB 2014             Birth Comments: System Generated. Please review and update pregnancy details.  3 Term 02/24/12 5472w3d 16:28 / 00:19 6 lb 3.5 oz (2.821 kg) F Vag-Spont EPI  LIV     Birth Comments: knot in cord  2 Term 06/10/96 2869w0d  7 lb 3 oz (3.26 kg) F Vag-Spont Other N LIV     Birth Comments: no complications  1 Term 05/07/94 3440w0d  6 lb 9 oz (2.977 kg) M Vag-Spont Other N LIV     Birth Comments: no complications     Past Medical History:  Diagnosis Date  . Abnormal Pap smear    cryo, rpt wnl  . Chlamydia   . Gonorrhea   . Urinary tract infection   . Vaginal Pap smear, abnormal    Past Surgical History:  Procedure Laterality Date  . THERAPEUTIC ABORTION     Family History  Problem Relation Age of Onset  . Hypertension Mother   . Stomach cancer Father   . Asthma Father   . Bronchitis Father   . Anesthesia problems Neg Hx      Exam    Uterus:  Fundal Height: 14 cm  Pelvic Exam:    Perineum: No Hemorrhoids   Vulva: normal   Vagina:  normal mucosa   pH:     Cervix: no lesions   Adnexa: normal adnexa   Bony Pelvis: average  System: Breast:  normal appearance, no masses or tenderness   Skin: normal coloration and turgor, no rashes    Neurologic: oriented, normal mood   Extremities: normal strength, tone, and muscle mass   HEENT PERRLA, sclera clear, anicteric,  oropharynx clear, no lesions, neck supple with midline trachea and thyroid without masses   Mouth/Teeth mucous membranes moist, pharynx normal without lesions and missing several teeth   Neck supple   Cardiovascular: regular rate and rhythm, no murmurs or gallops   Respiratory:  appears well, vitals normal, no respiratory distress, acyanotic, normal RR, neck free of mass or lymphadenopathy, chest clear, no wheezing, crepitations, rhonchi, normal symmetric air entry   Abdomen: soft, non-tender; bowel sounds normal; no masses,  no organomegaly   Urinary: urethral meatus normal      Assessment:    Pregnancy: [id number] Patient Active Problem List   Diagnosis Date Noted  . Supervision of other normal pregnancy, antepartum 08/10/2017        Plan:     Initial labs drawn. Prenatal vitamins. Problem list reviewed and updated. Genetic Screening discussed Panorama ordered  Ultrasound discussed; fetal survey: ordered.  Follow up in 4 weeks. 50% of 30 min visit spent on counseling and coordination  of care.     [name] 08/10/2017

## 2017-08-10 NOTE — Patient Instructions (Signed)
Second Trimester of Pregnancy The second trimester is from week 13 through week 28, month 4 through 6. This is often the time in pregnancy that you feel your best. Often times, morning sickness has lessened or quit. You may have more energy, and you may get hungry more often. Your unborn baby (fetus) is growing rapidly. At the end of the sixth month, he or she is about 9 inches long and weighs about 1 pounds. You will likely feel the baby move (quickening) between 18 and 20 weeks of pregnancy. Follow these instructions at home:  Avoid all smoking, herbs, and alcohol. Avoid drugs not approved by your doctor.  Do not use any tobacco products, including cigarettes, chewing tobacco, and electronic cigarettes. If you need help quitting, ask your doctor. You may get counseling or other support to help you quit.  Only take medicine as told by your doctor. Some medicines are safe and some are not during pregnancy.  Exercise only as told by your doctor. Stop exercising if you start having cramps.  Eat regular, healthy meals.  Wear a good support bra if your breasts are tender.  Do not use hot tubs, steam rooms, or saunas.  Wear your seat belt when driving.  Avoid raw meat, uncooked cheese, and liter boxes and soil used by cats.  Take your prenatal vitamins.  Take 1500-2000 milligrams of calcium daily starting at the 20th week of pregnancy until you deliver your baby.  Try taking medicine that helps you poop (stool softener) as needed, and if your doctor approves. Eat more fiber by eating fresh fruit, vegetables, and whole grains. Drink enough fluids to keep your pee (urine) clear or pale yellow.  Take warm water baths (sitz baths) to soothe pain or discomfort caused by hemorrhoids. Use hemorrhoid cream if your doctor approves.  If you have puffy, bulging veins (varicose veins), wear support hose. Raise (elevate) your feet for 15 minutes, 3-4 times a day. Limit salt in your diet.  Avoid heavy  lifting, wear low heals, and sit up straight.  Rest with your legs raised if you have leg cramps or low back pain.  Visit your dentist if you have not gone during your pregnancy. Use a soft toothbrush to brush your teeth. Be gentle when you floss.  You can have sex (intercourse) unless your doctor tells you not to.  Go to your doctor visits. Get help if:  You feel dizzy.  You have mild cramps or pressure in your lower belly (abdomen).  You have a nagging pain in your belly area.  You continue to feel sick to your stomach (nauseous), throw up (vomit), or have watery poop (diarrhea).  You have bad smelling fluid coming from your vagina.  You have pain with peeing (urination). Get help right away if:  You have a fever.  You are leaking fluid from your vagina.  You have spotting or bleeding from your vagina.  You have severe belly cramping or pain.  You lose or gain weight rapidly.  You have trouble catching your breath and have chest pain.  You notice sudden or extreme puffiness (swelling) of your face, hands, ankles, feet, or legs.  You have not felt the baby move in over an hour.  You have severe headaches that do not go away with medicine.  You have vision changes. This information is not intended to replace advice given to you by your health care provider. Make sure you discuss any questions you have with your health care   provider. Document Released: 11/08/2009 Document Revised: 01/20/2016 Document Reviewed: 10/15/2012 Elsevier Interactive Patient Education  2017 [employer] Inc.  

## 2017-08-10 NOTE — Progress Notes (Signed)
Here for new ob visit. Declines flu shot. Declines genetic testing. Would like to sign up for [employer]. Given new patient education booklets. Patient and/or legal guardian verbally consented to meet with [facility] about presenting concerns. Addendum:patient decided she would like to do panorama genetic testing which was obtained and being sent Panorama kit #[id number]  FEd Ex Confirmation #[id number].

## 2017-08-13 LAB — CYTOLOGY - PAP
DIAGNOSIS: NEGATIVE
HPV: DETECTED — AB

## 2017-08-14 LAB — URINE CULTURE, OB REFLEX

## 2017-08-14 LAB — CULTURE, OB URINE

## 2017-08-18 LAB — HEMOGLOBINOPATHY EVALUATION
Ferritin: 77 ng/mL (ref 15–150)
HGB A2 QUANT: 2.3 % (ref 1.8–3.2)
HGB A: 97.7 % (ref 96.4–98.8)
HGB C: 0 %
HGB F QUANT: 0 % (ref 0.0–2.0)
HGB S: 0 %
Hgb Solubility: NEGATIVE
Hgb Variant: 0 %

## 2017-08-18 LAB — OBSTETRIC PANEL, INCLUDING HIV
Antibody Screen: NEGATIVE
Basophils Absolute: 0 10*3/uL (ref 0.0–0.2)
Basos: 0 %
EOS (ABSOLUTE): 0.1 10*3/uL (ref 0.0–0.4)
EOS: 2 %
HEMOGLOBIN: 11.8 g/dL (ref 11.1–15.9)
HEP B S AG: NEGATIVE
HIV SCREEN 4TH GENERATION: NONREACTIVE
Hematocrit: 34.6 % (ref 34.0–46.6)
Immature Grans (Abs): 0 10*3/uL (ref 0.0–0.1)
Immature Granulocytes: 0 %
LYMPHS ABS: 2.9 10*3/uL (ref 0.7–3.1)
Lymphs: 35 %
MCH: 28.4 pg (ref 26.6–33.0)
MCHC: 34.1 g/dL (ref 31.5–35.7)
MCV: 83 fL (ref 79–97)
Monocytes Absolute: 0.5 10*3/uL (ref 0.1–0.9)
Monocytes: 6 %
NEUTROS ABS: 4.6 10*3/uL (ref 1.4–7.0)
Neutrophils: 57 %
PLATELETS: 267 10*3/uL (ref 150–379)
RBC: 4.16 x10E6/uL (ref 3.77–5.28)
RDW: 15.6 % — ABNORMAL HIGH (ref 12.3–15.4)
RH TYPE: POSITIVE
RPR: NONREACTIVE
Rubella Antibodies, IGG: 4.09 index (ref >0.99)
WBC: 8.2 10*3/uL (ref 3.4–10.8)

## 2017-08-18 LAB — SMN1 COPY NUMBER ANALYSIS (SMA CARRIER SCREENING)

## 2017-08-28 NOTE — L&D Delivery Note (Signed)
Patient is 42 y.o. [id number] 9158w3d admitted for IOL for gHTN. S/p IOL with Pitocin. AROM at 1150.  Prenatal course also complicated by none.  GBS Positive - received PCN  Delivery Note At 5:32 PM a viable female was delivered via Vaginal, Spontaneous (Presentation: LOA).  APGAR: 7, 9; weight pending 1hr skin to skin.  Placenta status: Intact.  Cord: 3V with the following complications: Tight nuchal.  Cord pH: N/A  Anesthesia: Epidural   Episiotomy: None Lacerations: None Suture Repair: None Est. Blood Loss (mL): 100  Mom to postpartum.  Baby to Couplet care / Skin to Skin.  Caryl AdaJazma Phelps, DO 01/30/2018, 6:47 PM

## 2017-08-29 ENCOUNTER — Encounter: Payer: Self-pay | Admitting: Obstetrics and Gynecology

## 2017-08-30 DIAGNOSIS — Z3480 Encounter for supervision of other normal pregnancy, unspecified trimester: Secondary | ICD-10-CM

## 2017-09-03 ENCOUNTER — Encounter: Payer: Self-pay | Admitting: *Deleted

## 2017-09-10 ENCOUNTER — Encounter (HOSPITAL_COMMUNITY): Payer: Self-pay | Admitting: Obstetrics & Gynecology

## 2017-09-10 ENCOUNTER — Encounter: Payer: BLUE CROSS/BLUE SHIELD | Admitting: Obstetrics & Gynecology

## 2017-09-17 ENCOUNTER — Other Ambulatory Visit: Payer: Self-pay | Admitting: Obstetrics & Gynecology

## 2017-09-17 ENCOUNTER — Ambulatory Visit (HOSPITAL_COMMUNITY)
Admission: RE | Admit: 2017-09-17 | Discharge: 2017-09-17 | Disposition: A | Discharge status: Home / Self Care | Payer: BLUE CROSS/BLUE SHIELD | Source: Ambulatory Visit | Attending: Obstetrics & Gynecology | Admitting: Obstetrics & Gynecology

## 2017-09-17 DIAGNOSIS — O09522 Supervision of elderly multigravida, second trimester: Secondary | ICD-10-CM

## 2017-09-17 DIAGNOSIS — Z3689 Encounter for other specified antenatal screening: Secondary | ICD-10-CM

## 2017-09-17 DIAGNOSIS — Z3480 Encounter for supervision of other normal pregnancy, unspecified trimester: Secondary | ICD-10-CM | POA: Insufficient documentation

## 2017-09-17 DIAGNOSIS — Z3A19 19 weeks gestation of pregnancy: Secondary | ICD-10-CM

## 2017-09-17 DIAGNOSIS — O99332 Smoking (tobacco) complicating pregnancy, second trimester: Secondary | ICD-10-CM

## 2017-09-20 ENCOUNTER — Ambulatory Visit (INDEPENDENT_AMBULATORY_CARE_PROVIDER_SITE_OTHER): Payer: BLUE CROSS/BLUE SHIELD | Admitting: Obstetrics and Gynecology

## 2017-09-20 ENCOUNTER — Encounter: Payer: Self-pay | Admitting: Obstetrics and Gynecology

## 2017-09-20 VITALS — BP 115/69 | HR 79 | Wt 160.0 lb

## 2017-09-20 DIAGNOSIS — Z3480 Encounter for supervision of other normal pregnancy, unspecified trimester: Secondary | ICD-10-CM

## 2017-09-20 DIAGNOSIS — O09529 Supervision of elderly multigravida, unspecified trimester: Secondary | ICD-10-CM

## 2017-09-20 DIAGNOSIS — O09522 Supervision of elderly multigravida, second trimester: Secondary | ICD-10-CM

## 2017-09-20 NOTE — Progress Notes (Signed)
   PRENATAL VISIT NOTE  Subjective:  Melinda Simmons is a 42 y.o. [id number] at 5776w4d being seen today for ongoing prenatal care.  She is currently monitored for the following issues for this low-risk pregnancy and has Supervision of other normal pregnancy, antepartum and Antepartum multigravida of advanced maternal age on their problem list.  Patient reports no complaints.  Contractions: Not present. Vag. Bleeding: None.  Movement: Present. Denies leaking of fluid.   The following portions of the patient's history were reviewed and updated as appropriate: allergies, current medications, past family history, past medical history, past social history, past surgical history and problem list. Problem list updated.  Objective:   Vitals:   09/20/17 1451  BP: 115/69  Pulse: 79  Weight: 160 lb (72.6 kg)    Fetal Status: Fetal Heart Rate (bpm): 145 Fundal Height: 20 cm Movement: Present     General:  Alert, oriented and cooperative. Patient is in no acute distress.  Skin: Skin is warm and dry. No rash noted.   Cardiovascular: Normal heart rate noted  Respiratory: Normal respiratory effort, no problems with respiration noted  Abdomen: Soft, gravid, appropriate for gestational age.  Pain/Pressure: Absent     Pelvic: Cervical exam deferred        Extremities: Normal range of motion.  Edema: None  Mental Status:  Normal mood and affect. Normal behavior. Normal judgment and thought content.   Assessment and Plan:  Pregnancy: [id number] at 2776w4d  1. Supervision of other normal pregnancy, antepartum Patient is doing well without complaints Anatomy ultrasound results reviewed with the patient Follow up anatomy ultrasound ordered - US MFM OB FOLLOW UP; Future  2. Antepartum multigravida of advanced maternal age Low risks NIPS  Preterm labor symptoms and general obstetric precautions including but not limited to vaginal bleeding, contractions, leaking of fluid and fetal movement were reviewed  in detail with the patient. Please refer to After Visit Summary for other counseling recommendations.  Return in about 4 weeks (around 10/18/2017) for ROB.   Catalina AntiguaPeggy Kentarius Partington, MD

## 2017-10-18 ENCOUNTER — Ambulatory Visit (INDEPENDENT_AMBULATORY_CARE_PROVIDER_SITE_OTHER): Payer: BLUE CROSS/BLUE SHIELD | Admitting: Obstetrics & Gynecology

## 2017-10-18 ENCOUNTER — Ambulatory Visit (INDEPENDENT_AMBULATORY_CARE_PROVIDER_SITE_OTHER): Payer: BLUE CROSS/BLUE SHIELD | Admitting: Clinical

## 2017-10-18 VITALS — BP 120/82 | HR 92

## 2017-10-18 DIAGNOSIS — Z3480 Encounter for supervision of other normal pregnancy, unspecified trimester: Secondary | ICD-10-CM

## 2017-10-18 DIAGNOSIS — O09529 Supervision of elderly multigravida, unspecified trimester: Secondary | ICD-10-CM

## 2017-10-18 DIAGNOSIS — O09522 Supervision of elderly multigravida, second trimester: Secondary | ICD-10-CM

## 2017-10-18 DIAGNOSIS — F4323 Adjustment disorder with mixed anxiety and depressed mood: Secondary | ICD-10-CM

## 2017-10-18 NOTE — Progress Notes (Signed)
   PRENATAL VISIT NOTE  Subjective:  Melinda Simmons is a 42 y.o. [phone] [id number] at 7546w4d being seen today for ongoing prenatal care.  She is currently monitored for the following issues for this high-risk pregnancy and has Supervision of other normal pregnancy, antepartum and Antepartum multigravida of advanced maternal age on their problem list.  Patient reports abdominal pain.  Contractions: Not present. Vag. Bleeding: None.  Movement: Present. Denies leaking of fluid.   The following portions of the patient's history were reviewed and updated as appropriate: allergies, current medications, past family history, past medical history, past social history, past surgical history and problem list. Problem list updated.  Objective:   Vitals:   10/18/17 1524  BP: 120/82  Pulse: 92    Fetal Status: Fetal Heart Rate (bpm): 148   Movement: Present     General:  Alert, oriented and cooperative. Patient is in no acute distress.  Skin: Skin is warm and dry. No rash noted.   Cardiovascular: Normal heart rate noted  Respiratory: Normal respiratory effort, no problems with respiration noted  Abdomen: Soft, gravid, appropriate for gestational age.  Pain/Pressure: Present     Pelvic: Cervical exam performed      eternal os open, internal os is closed.  Extremities: Normal range of motion.  Edema: None  Mental Status:  Normal mood and affect. Normal behavior. Normal judgment and thought content.   Assessment and Plan:  Pregnancy: [id number] at 946w4d  1. AMA (advanced maternal age) multigravida 35+, second trimester - HgB A1c--wasn't done at first visit  2. Supervision of other normal pregnancy, antepartum -Pt was treated for BV.  Now having itching.  Will check wet prep again. Saw Jamie--see her note.  3. Abdominal Pain Pt having "balling up".  Not having symptoms 5 times in a hour.  Cervix closed today.  Given preterm labor precautions.    Preterm labor symptoms and general obstetric  precautions including but not limited to vaginal bleeding, contractions, leaking of fluid and fetal movement were reviewed in detail with the patient. Please refer to After Visit Summary for other counseling recommendations.   RTC 3 weeks.    Elsie LincolnKelly Shuayb Schepers, MD

## 2017-10-18 NOTE — BH Specialist Note (Signed)
Integrated Behavioral Health Initial Visit  MRN: [id number] Name: Delfina Redwoodharlette N Kinley  Number of Integrated Behavioral Health Clinician visits:: 1/6 Session Start time: 2:51 PM   Session End time: 3:20 Total time: 30 minutes  Type of Service: Integrated Behavioral Health- Individual/Family Interpretor:No. Interpretor Name and Language: n/a   Warm Hand Off Completed.       SUBJECTIVE: Louie N Tancredi is a 42 y.o. female accompanied by n/a Patient was referred by Dr Debroah LoopArnold for symptoms of depression and anxiety. Patient reports the following symptoms/concerns: Pt states her primary concern today is feeling irritable, stressed, fatigue, depressed, teary, overly anxious and worried. Pt is open to learning self-coping strategy to help manage her emotions, and feelings of grief. Pt lost her boyfriend of 10 years to suicide in March 2018. Duration of problem: Increase in current pregnancy; Severity of problem: moderate  OBJECTIVE: Mood: Anxious and Affect: Tearful Risk of harm to self or others: No plan to harm self or others  LIFE CONTEXT: Family and Social: Pt has  School/Work: Working full-time Self-Care: Smoking cigarettes to cope with stress; recognizing a greater need for healthy self-care Life Changes: Current pregnancy; close to one year anniversary of previous boyfriends' suicide  GOALS ADDRESSED: Patient will: 1. Reduce symptoms of: agitation, anxiety, depression and stress 2. Increase knowledge and/or ability of: healthy habits and self-management skills  3. Demonstrate ability to: Increase healthy adjustment to current life circumstances  INTERVENTIONS: Interventions utilized: Mindfulness or Management consultantelaxation Training, Sleep Hygiene and Psychoeducation and/or Health Education  Standardized Assessments completed: GAD-7 and PHQ 9  ASSESSMENT: Patient currently experiencing Adjustment disorder with mixed anxious and depressed mood.   Patient may benefit from  psychoeducation and brief therapeutic interventions regarding coping with symptoms of anxiety and depression .  PLAN: 1. Follow up with behavioral health clinician on : One month, or earlier, as requested by patient 2. Behavioral recommendations:  -CALM relaxation breathing exercise every morning and evening; continue for as long as remains helpful -Consider apps as additional coping strategies for self and 5yo daughter -Read educational materials regarding coping with symptoms of anxiety and depression  3. Referral(s): [facility] 4. "From scale of 1-10, how likely are you to follow plan?": 9  Rae LipsJamie C Anett Ranker, LCSW       Depression screen [facility] 08/10/2017  Decreased Interest 2  Down, Depressed, Hopeless 2  PHQ - 2 Score 4  Altered sleeping 1  Tired, decreased energy 3  Change in appetite 1  Feeling bad or failure about yourself  1  Trouble concentrating 0  Moving slowly or fidgety/restless 0  Suicidal thoughts 0  PHQ-9 Score 10   GAD 7 : Generalized Anxiety Score 08/10/2017  Nervous, Anxious, on Edge 2  Control/stop worrying 2  Worry too much - different things 2  Trouble relaxing 2  Restless 0  Easily annoyed or irritable 1  Afraid - awful might happen 2  Total GAD 7 Score 11

## 2017-10-19 LAB — HEMOGLOBIN A1C
ESTIMATED AVERAGE GLUCOSE: 105 mg/dL
Hgb A1c MFr Bld: 5.3 % (ref 4.8–5.6)

## 2017-10-20 LAB — URINE CULTURE, OB REFLEX

## 2017-10-20 LAB — CULTURE, OB URINE

## 2017-10-22 ENCOUNTER — Ambulatory Visit (HOSPITAL_COMMUNITY)
Admission: RE | Admit: 2017-10-22 | Discharge: 2017-10-22 | Disposition: A | Discharge status: Home / Self Care | Payer: BLUE CROSS/BLUE SHIELD | Source: Ambulatory Visit | Attending: Obstetrics and Gynecology | Admitting: Obstetrics and Gynecology

## 2017-10-22 ENCOUNTER — Other Ambulatory Visit: Payer: Self-pay | Admitting: Obstetrics and Gynecology

## 2017-10-22 DIAGNOSIS — F1290 Cannabis use, unspecified, uncomplicated: Secondary | ICD-10-CM | POA: Insufficient documentation

## 2017-10-22 DIAGNOSIS — O99322 Drug use complicating pregnancy, second trimester: Secondary | ICD-10-CM | POA: Diagnosis not present

## 2017-10-22 DIAGNOSIS — Z362 Encounter for other antenatal screening follow-up: Secondary | ICD-10-CM | POA: Insufficient documentation

## 2017-10-22 DIAGNOSIS — Z3480 Encounter for supervision of other normal pregnancy, unspecified trimester: Secondary | ICD-10-CM | POA: Diagnosis present

## 2017-10-22 DIAGNOSIS — Z363 Encounter for antenatal screening for malformations: Secondary | ICD-10-CM | POA: Insufficient documentation

## 2017-10-22 DIAGNOSIS — Z3A24 24 weeks gestation of pregnancy: Secondary | ICD-10-CM

## 2017-10-22 DIAGNOSIS — Z0489 Encounter for examination and observation for other specified reasons: Secondary | ICD-10-CM

## 2017-10-22 DIAGNOSIS — IMO0002 Reserved for concepts with insufficient information to code with codable children: Secondary | ICD-10-CM

## 2017-11-07 ENCOUNTER — Ambulatory Visit (INDEPENDENT_AMBULATORY_CARE_PROVIDER_SITE_OTHER): Payer: BLUE CROSS/BLUE SHIELD | Admitting: Obstetrics & Gynecology

## 2017-11-07 VITALS — BP 117/74 | HR 75 | Wt 164.9 lb

## 2017-11-07 DIAGNOSIS — Z23 Encounter for immunization: Secondary | ICD-10-CM

## 2017-11-07 DIAGNOSIS — Z3493 Encounter for supervision of normal pregnancy, unspecified, third trimester: Secondary | ICD-10-CM

## 2017-11-07 NOTE — Patient Instructions (Signed)

## 2017-11-07 NOTE — Progress Notes (Signed)
   PRENATAL VISIT NOTE  Subjective:  Melinda Simmons is a 42 y.o. [phone] at [redacted]w[redacted]d being seen today for ongoing prenatal care.  She is currently monitored for the following issues for this low-risk pregnancy and has Supervision of other normal pregnancy, antepartum and Antepartum multigravida of advanced maternal age on their problem list.  Patient reports no complaints.  Contractions: Not present. Vag. Bleeding: None.  Movement: Present. Denies leaking of fluid.   The following portions of the patient's history were reviewed and updated as appropriate: allergies, current medications, past family history, past medical history, past social history, past surgical history and problem list. Problem list updated.  Objective:   Vitals:   11/07/17 1003  BP: 117/74  Pulse: 75  Weight: 164 lb 14.4 oz (74.8 kg)    Fetal Status:     Movement: Present     General:  Alert, oriented and cooperative. Patient is in no acute distress.  Skin: Skin is warm and dry. No rash noted.   Cardiovascular: Normal heart rate noted  Respiratory: Normal respiratory effort, no problems with respiration noted  Abdomen: Soft, gravid, appropriate for gestational age.  Pain/Pressure: Present     Pelvic: Cervical exam deferred        Extremities: Normal range of motion.  Edema: None  Mental Status:  Normal mood and affect. Normal behavior. Normal judgment and thought content.   Assessment and Plan:  Pregnancy: [id number] at [redacted]w[redacted]d  1. Supervision of low-risk pregnancy, third trimester Needs 28 week labs next visit  2. Need for diphtheria-tetanus-pertussis (Tdap) vaccine Next visit - Tdap vaccine greater than or equal to 7yo IM  Preterm labor symptoms and general obstetric precautions including but not limited to vaginal bleeding, contractions, leaking of fluid and fetal movement were reviewed in detail with the patient. Please refer to After Visit Summary for other counseling recommendations.  Return in about  2 weeks (around 11/21/2017).   [name], MD

## 2017-11-14 NOTE — BH Specialist Note (Deleted)
Integrated Behavioral Health Follow Up Visit  MRN: [id number] Name: Melinda Simmons  Number of Integrated Behavioral Health Clinician visits: 2/6 Session Start time: ***  Session End time: *** Total time: {IBH Total Time:21014050}  Type of Service: Integrated Behavioral Health- Individual/Family Interpretor:No. Interpretor Name and Language: n/a  SUBJECTIVE: Melinda Simmons is a 42 y.o. female accompanied by {Patient accompanied by:[phone]} Patient was referred by Dr Debroah LoopArnold for symptoms of anxiety and depression. Patient reports the following symptoms/concerns: Pt states her primary concern today is *** Duration of problem: Current pregnancy; Severity of problem: {Mild/Moderate/Severe:20260}  OBJECTIVE: Mood: {BHH MOOD:22306} and Affect: {BHH AFFECT:22307} Risk of harm to self or others: No plan to harm self or others ***  LIFE CONTEXT: Family and Social: *** School/Work: Working fulltime *** Self-Care: Cigarettes to cope with stress *** Life Changes: Current pregnancy; over one year after previous boyfriends' suicide ***  GOALS ADDRESSED: Patient will: 1.  Reduce symptoms of: agitation, anxiety, depression and stress  2.  Increase knowledge and/or ability of: {IBH Patient Tools:21014057}  3.  Demonstrate ability to: {IBH Goals:21014053}  INTERVENTIONS: Interventions utilized:  {IBH Interventions:21014054} Standardized Assessments completed: GAD-7 and PHQ 9  ASSESSMENT: Patient currently experiencing Adjustment disorder with mixed anxious and depressed mood ***  Patient may benefit from continued brief therapeutic interventions regarding coping with symptoms of anxiety and depression ***.  PLAN: 1. Follow up with behavioral health clinician on : *** 2. Behavioral recommendations:  -*** -CALM relaxation breathing exercise twice daily (morning and evening) *** -Continue using apps as an additional coping strategy ***  3. Referral(s): {IBH  Referrals:21014055} 4. "From scale of 1-10, how likely are you to follow plan?": ***  Rae LipsJamie C McMannes, LCSW  [facility] 11/07/2017 10/18/2017 08/10/2017  Decreased Interest 2 2 2   Down, Depressed, Hopeless 0 2 2  PHQ - 2 Score 2 4 4   Altered sleeping 3 0 1  Tired, decreased energy 3 2 3   Change in appetite 0 2 1  Feeling bad or failure about yourself  0 2 1  Trouble concentrating 0 0 0  Moving slowly or fidgety/restless 0 0 0  Suicidal thoughts 0 0 0  PHQ-9 Score 8 10 10    GAD 7 : Generalized Anxiety Score 11/07/2017 10/18/2017 08/10/2017  Nervous, Anxious, on Edge 2 2 2   Control/stop worrying 2 2 2   Worry too much - different things 2 2 2   Trouble relaxing 2 2 2   Restless 0 0 0  Easily annoyed or irritable 2 3 1   Afraid - awful might happen 2 2 2   Total GAD 7 Score 12 13 11

## 2017-11-15 ENCOUNTER — Ambulatory Visit: Payer: Self-pay

## 2017-11-22 ENCOUNTER — Other Ambulatory Visit: Payer: Self-pay

## 2017-11-22 DIAGNOSIS — Z3480 Encounter for supervision of other normal pregnancy, unspecified trimester: Secondary | ICD-10-CM

## 2017-11-23 ENCOUNTER — Other Ambulatory Visit: Payer: Self-pay

## 2017-11-23 ENCOUNTER — Encounter: Payer: Self-pay | Admitting: Obstetrics & Gynecology

## 2017-11-23 DIAGNOSIS — Z3480 Encounter for supervision of other normal pregnancy, unspecified trimester: Secondary | ICD-10-CM

## 2017-12-02 ENCOUNTER — Encounter (HOSPITAL_COMMUNITY): Payer: Self-pay | Admitting: *Deleted

## 2017-12-02 ENCOUNTER — Inpatient Hospital Stay (HOSPITAL_COMMUNITY)
Admission: AD | Admit: 2017-12-02 | Discharge: 2017-12-02 | Disposition: A | Discharge status: Home / Self Care | Payer: BLUE CROSS/BLUE SHIELD | Source: Ambulatory Visit | Attending: Obstetrics and Gynecology | Admitting: Obstetrics and Gynecology

## 2017-12-02 DIAGNOSIS — O26893 Other specified pregnancy related conditions, third trimester: Secondary | ICD-10-CM | POA: Diagnosis not present

## 2017-12-02 DIAGNOSIS — R102 Pelvic and perineal pain: Secondary | ICD-10-CM | POA: Diagnosis present

## 2017-12-02 DIAGNOSIS — Z3A30 30 weeks gestation of pregnancy: Secondary | ICD-10-CM | POA: Insufficient documentation

## 2017-12-02 DIAGNOSIS — F17210 Nicotine dependence, cigarettes, uncomplicated: Secondary | ICD-10-CM | POA: Diagnosis not present

## 2017-12-02 DIAGNOSIS — N949 Unspecified condition associated with female genital organs and menstrual cycle: Secondary | ICD-10-CM

## 2017-12-02 DIAGNOSIS — O99333 Smoking (tobacco) complicating pregnancy, third trimester: Secondary | ICD-10-CM | POA: Insufficient documentation

## 2017-12-02 DIAGNOSIS — Z3689 Encounter for other specified antenatal screening: Secondary | ICD-10-CM

## 2017-12-02 DIAGNOSIS — Z3480 Encounter for supervision of other normal pregnancy, unspecified trimester: Secondary | ICD-10-CM

## 2017-12-02 LAB — URINALYSIS, ROUTINE W REFLEX MICROSCOPIC
Bilirubin Urine: NEGATIVE
GLUCOSE, UA: NEGATIVE mg/dL
Hgb urine dipstick: NEGATIVE
Ketones, ur: NEGATIVE mg/dL
Nitrite: NEGATIVE
PROTEIN: 30 mg/dL — AB
Specific Gravity, Urine: 1.015 (ref 1.005–1.030)
pH: 6 (ref 5.0–8.0)

## 2017-12-02 LAB — WET PREP, GENITAL
CLUE CELLS WET PREP: NONE SEEN
Sperm: NONE SEEN
TRICH WET PREP: NONE SEEN
Yeast Wet Prep HPF POC: NONE SEEN

## 2017-12-02 MED ORDER — ACETAMINOPHEN 500 MG PO TABS
1000.0000 mg | ORAL_TABLET | Freq: Once | ORAL | Status: AC
  Administered 2017-12-02: 1000 mg via ORAL
  Filled 2017-12-02: qty 2

## 2017-12-02 NOTE — Progress Notes (Signed)
Dr Degele in earlier to discuss test results and d/c plan. Written and verbal d/c instructions given and understanding voiced 

## 2017-12-02 NOTE — MAU Note (Addendum)
I worked 12hrs Friday and lift boxes some. Having pain groin area both sides and worse with movement. Denies vag bleeding or LOF

## 2017-12-02 NOTE — Progress Notes (Signed)
Ivonne AndrewV. Smith CNM helping with pt in BS currently. Aware of pt's admission and status.

## 2017-12-02 NOTE — MAU Provider Note (Signed)
History  CSN: [id number] Arrival date and time: 12/02/17 0101  First Provider Initiated Contact with Patient 12/02/17 0244      Chief Complaint  Patient presents with  . Pelvic Pain  . Leg Pain    HPI: Melinda Simmons is a 42 y.o. [phone] with IUP at [redacted]w[redacted]d who presents to maternity admissions reporting bilateral groin pain. Reports pain started after lifting some boxes at work yesterday, and has been persistent since. Denies any other associated symptoms. Reports occasional tightening in abdomen, but does feel like labor contractions. Denies vaginal bleeding, but endorses some vaginal discharge. Also endorses urinary frequency, but denies dysuria or hematuria. Also denies fever, chills, nausea, vomiting, diarrhea, RUQ/epigastric pain, dizziness/lighreadhess, or headache. She has not tried taking anything for her pain at home.   She [facility]. Pregnancy complicated by AMA.   OB History  Gravida Para Term Preterm AB Living  6 3 3  0 2 3  SAB TAB Ectopic Multiple Live Births  0 2 0 0 3    # Outcome Date GA Lbr Len/2nd Weight Sex Delivery Anes PTL Lv  6 Current           5 TAB 2016          4 TAB 2014             Birth Comments: System Generated. Please review and update pregnancy details.  3 Term 02/24/12 [redacted]w[redacted]d 16:28 / 00:19 6 lb 3.5 oz (2.821 kg) F Vag-Spont EPI  LIV     Birth Comments: knot in cord  2 Term 06/10/96 [redacted]w[redacted]d  7 lb 3 oz (3.26 kg) F Vag-Spont Other N LIV     Birth Comments: no complications  1 Term 05/07/94 [redacted]w[redacted]d  6 lb 9 oz (2.977 kg) M Vag-Spont Other N LIV     Birth Comments: no complications   Past Medical History:  Diagnosis Date  . Abnormal Pap smear    cryo, rpt wnl  . Chlamydia   . Gonorrhea   . Urinary tract infection   . Vaginal Pap smear, abnormal    Past Surgical History:  Procedure Laterality Date  . THERAPEUTIC ABORTION     Family History  Problem Relation Age of Onset  . Hypertension Mother   . Stomach cancer Father   . Asthma  Father   . Bronchitis Father   . Anesthesia problems Neg Hx    Social History   Socioeconomic History  . Marital status: Single    Spouse name: Not on file  . Number of children: Not on file  . Years of education: Not on file  . Highest education level: Not on file  Occupational History  . Not on file  Social Needs  . Financial resource strain: Not on file  . Food insecurity:    Worry: Not on file    Inability: Not on file  . Transportation needs:    Medical: Not on file    Non-medical: Not on file  Tobacco Use  . Smoking status: Current Every Day Smoker    Packs/day: 0.25    Years: 10.00    Pack years: 2.50    Types: Cigarettes  . Smokeless tobacco: Never Used  . Tobacco comment: cutting back  Substance and Sexual Activity  . Alcohol use: No  . 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 Sexual activity: Yes    Birth control/protection: None  Lifestyle  .  Physical activity:    Days per week: Not on file    Minutes per session: Not on file  . Stress: Not on file  Relationships  . Social connections:    Talks on phone: Not on file    Gets together: Not on file    Attends religious service: Not on file    Active member of club or organization: Not on file    Attends meetings of clubs or organizations: Not on file    Relationship status: Not on file  . Intimate partner violence:    Fear of current or ex partner: Not on file    Emotionally abused: Not on file    Physically abused: Not on file    Forced sexual activity: Not on file  Other Topics Concern  . Not on file  Social History Narrative  . Not on file   Allergies  Allergen Reactions  . Motrin [Ibuprofen] Other (See Comments)    Causes Bleeding.    Medications Prior to Admission  Medication Sig Dispense Refill Last Dose  . flintstones complete (FLINTSTONES) 60 MG chewable tablet Chew 2 tablets by mouth daily.   12/02/2017 at Unknown time  . metroNIDAZOLE (FLAGYL) 500  MG tablet Take 1 tablet (500 mg total) by mouth 2 (two) times daily. (Patient not taking: Reported on 09/20/2017) 14 tablet 0 Not Taking    I have reviewed patient's Past Medical Hx, Surgical Hx, Family Hx, Social Hx, medications and allergies.   Review of Systems: Negative except for what is mentioned in HPI.  Physical Exam   Blood pressure 114/70, pulse 90, temperature 98.5 F (36.9 C), resp. rate 20, height 5' 4.5" (1.638 m), weight 173 lb (78.5 kg), last menstrual period 05/06/2017, unknown if currently breastfeeding.  Constitutional: Well-developed, well-nourished female in no acute distress.  HENT: [place]/AT, normal oropharynx mucosa. MMM Eyes: normal conjunctivae, no scleral icterus Cardiovascular: normal rate, regular rhythm Respiratory: normal effort, lungs CTAB.  GI: Abd soft, non-tender, gravid appropriate for gestational age.   GU: Neg CVAT bilaterally Pelvic: NEFG. Normal vaginal mucosa without lesions. Cervix pink, visually closed, without lesion, moderate yellowish-white discharge SVE: FT/long/high MSK: Extremities nontender, no edema. No back TTP Neurologic: Alert and oriented x 4. Psych: Normal mood and affect Skin: warm and dry   FHT:  Baseline 135 , moderate variability, accelerations present, no decelerations Toco: none  MAU Course/MDM:   Nursing notes and VS reviewed. Patient seen and examined, as noted above.  Reactive NST Pain c/w round ligament pain Cultures collect to r/o infection  UA:   Results for orders placed or performed during the hospital encounter of 12/02/17  Urinalysis, Routine w reflex microscopic  Result Value Ref Range   Color, Urine YELLOW YELLOW   APPearance CLEAR CLEAR   Specific Gravity, Urine 1.015 1.005 - 1.030   pH 6.0 5.0 - 8.0   Glucose, UA NEGATIVE NEGATIVE mg/dL   Hgb urine dipstick NEGATIVE NEGATIVE   Bilirubin Urine NEGATIVE NEGATIVE   Ketones, ur NEGATIVE NEGATIVE mg/dL   Protein, ur 30 (A) NEGATIVE mg/dL   Nitrite  NEGATIVE NEGATIVE   Leukocytes, UA TRACE (A) NEGATIVE   RBC / HPF 0-5 0 - 5 RBC/hpf   WBC, UA 6-30 0 - 5 WBC/hpf   Bacteria, UA RARE (A) NONE SEEN   Squamous Epithelial / LPF 0-5 (A) NONE SEEN   Mucus PRESENT    Tylenol given for pain Pain improved.   Wet prep normal  Assessment and Plan  Assessment: 1.  Pain of round ligament   2. Supervision of other normal pregnancy, antepartum   3. Pelvic pain affecting pregnancy in third trimester, antepartum   4. NST (non-stress test) reactive     Plan: --Discharge home in stable condition.  --Discussed taking Tylenol prn at home, and getting a pregnancy support belt --Return precautions    Laporshia Hogen, Kandra NicolasJulie P, MD 12/02/2017 3:22 AM

## 2017-12-02 NOTE — Discharge Instructions (Signed)

## 2017-12-02 NOTE — Progress Notes (Signed)
OK to d/c efm per Dr Nira Retortegele

## 2017-12-03 LAB — GC/CHLAMYDIA PROBE AMP (~~LOC~~) NOT AT ARMC
CHLAMYDIA, DNA PROBE: NEGATIVE
Neisseria Gonorrhea: NEGATIVE

## 2017-12-04 LAB — CULTURE, OB URINE

## 2017-12-05 ENCOUNTER — Other Ambulatory Visit: Payer: Self-pay | Admitting: Certified Nurse Midwife

## 2017-12-05 DIAGNOSIS — O2343 Unspecified infection of urinary tract in pregnancy, third trimester: Secondary | ICD-10-CM

## 2017-12-05 MED ORDER — NITROFURANTOIN MONOHYD MACRO 100 MG PO CAPS: 100.0000 mg | ORAL_CAPSULE | Freq: Two times a day (BID) | ORAL | 0 refills | Status: DC

## 2017-12-05 NOTE — Progress Notes (Signed)
Patient called and went over results of UTI. Rx sent to pharmacy of choice.

## 2017-12-06 ENCOUNTER — Ambulatory Visit (INDEPENDENT_AMBULATORY_CARE_PROVIDER_SITE_OTHER): Payer: BLUE CROSS/BLUE SHIELD | Admitting: Obstetrics and Gynecology

## 2017-12-06 ENCOUNTER — Ambulatory Visit: Payer: Self-pay | Admitting: Clinical

## 2017-12-06 ENCOUNTER — Encounter: Payer: Self-pay | Admitting: *Deleted

## 2017-12-06 VITALS — BP 119/73 | HR 67 | Wt 173.2 lb

## 2017-12-06 DIAGNOSIS — O09529 Supervision of elderly multigravida, unspecified trimester: Secondary | ICD-10-CM

## 2017-12-06 DIAGNOSIS — O2343 Unspecified infection of urinary tract in pregnancy, third trimester: Secondary | ICD-10-CM

## 2017-12-06 DIAGNOSIS — Z23 Encounter for immunization: Secondary | ICD-10-CM

## 2017-12-06 DIAGNOSIS — O09523 Supervision of elderly multigravida, third trimester: Secondary | ICD-10-CM

## 2017-12-06 DIAGNOSIS — Z3480 Encounter for supervision of other normal pregnancy, unspecified trimester: Secondary | ICD-10-CM

## 2017-12-06 NOTE — Progress Notes (Addendum)
Prenatal Visit Note Date: 12/06/2017 Clinic: [facility]  Subjective:  Melinda Simmons is a 42 y.o. [phone] at 5860w4d being seen today for ongoing prenatal care.  She is currently monitored for the following issues for this low-risk pregnancy and has Supervision of other normal pregnancy, antepartum; Antepartum multigravida of advanced maternal age; and UTI (urinary tract infection) during pregnancy, third trimester on their problem list.  Patient reports no complaints.   Contractions: Not present. Vag. Bleeding: None.  Movement: Present. Denies leaking of fluid.   The following portions of the patient's history were reviewed and updated as appropriate: allergies, current medications, past family history, past medical history, past social history, past surgical history and problem list. Problem list updated.  Objective:   Vitals:   12/06/17 1002  BP: 119/73  Pulse: 67  Weight: 173 lb 3.2 oz (78.6 kg)    Fetal Status: Fetal Heart Rate (bpm): 137 Fundal Height: 30 cm Movement: Present     General:  Alert, oriented and cooperative. Patient is in no acute distress.  Skin: Skin is warm and dry. No rash noted.   Cardiovascular: Normal heart rate noted  Respiratory: Normal respiratory effort, no problems with respiration noted  Abdomen: Soft, gravid, appropriate for gestational age. Pain/Pressure: Present     Pelvic:  Cervical exam deferred        Extremities: Normal range of motion.  Edema: Trace  Mental Status: Normal mood and affect. Normal behavior. Normal judgment and thought content.   Urinalysis:      Assessment and Plan:  Pregnancy: [id number] at 1860w4d  1. Supervision of other normal pregnancy, antepartum Desires BTL. Papers signed today. Pt unable to stay for full 2hr - Tdap vaccine greater than or equal to 7yo IM - CBC - RPR - HIV antibody - Glucose tolerance, 1 hour  2. UTI (urinary tract infection) during pregnancy, third trimester Pt to start  abx today   Preterm labor symptoms and general obstetric precautions including but not limited to vaginal bleeding, contractions, leaking of fluid and fetal movement were reviewed in detail with the patient. Please refer to After Visit Summary for other counseling recommendations.  Return in about 2 weeks (around 12/20/2017) for low risk ob.   [place] BingPickens, Sanya Kobrin, MD

## 2017-12-06 NOTE — Progress Notes (Signed)
Tdap given in  Right arm at 10:20
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
## 2017-12-07 LAB — GLUCOSE TOLERANCE, 1 HOUR: Glucose, 1Hr PP: 93 mg/dL (ref 65–199)

## 2017-12-07 LAB — CBC
HEMOGLOBIN: 10.4 g/dL — AB (ref 11.1–15.9)
Hematocrit: 32.3 % — ABNORMAL LOW (ref 34.0–46.6)
MCH: 28.5 pg (ref 26.6–33.0)
MCHC: 32.2 g/dL (ref 31.5–35.7)
MCV: 89 fL (ref 79–97)
PLATELETS: 248 10*3/uL (ref 150–379)
RBC: 3.65 x10E6/uL — AB (ref 3.77–5.28)
RDW: 15.5 % — ABNORMAL HIGH (ref 12.3–15.4)
WBC: 9 10*3/uL (ref 3.4–10.8)

## 2017-12-07 LAB — RPR: RPR: NONREACTIVE

## 2017-12-07 LAB — HIV ANTIBODY (ROUTINE TESTING W REFLEX): HIV SCREEN 4TH GENERATION: NONREACTIVE

## 2017-12-20 ENCOUNTER — Ambulatory Visit (INDEPENDENT_AMBULATORY_CARE_PROVIDER_SITE_OTHER): Payer: BLUE CROSS/BLUE SHIELD | Admitting: Obstetrics and Gynecology

## 2017-12-20 VITALS — BP 118/65 | HR 83 | Wt 179.2 lb

## 2017-12-20 DIAGNOSIS — Z029 Encounter for administrative examinations, unspecified: Secondary | ICD-10-CM

## 2017-12-20 DIAGNOSIS — Z3480 Encounter for supervision of other normal pregnancy, unspecified trimester: Secondary | ICD-10-CM

## 2017-12-20 DIAGNOSIS — Z3483 Encounter for supervision of other normal pregnancy, third trimester: Secondary | ICD-10-CM

## 2017-12-20 MED ORDER — FERROUS GLUCONATE 324 (38 FE) MG PO TABS: 324.0000 mg | ORAL_TABLET | Freq: Every day | ORAL | 1 refills | Status: DC

## 2017-12-20 NOTE — Progress Notes (Signed)
Prenatal Visit Note Date: 12/20/2017 Clinic: [facility]  Subjective:  Melinda Simmons is a 42 y.o. [phone] at 4237w4d being seen today for ongoing prenatal care.  She is currently monitored for the following issues for this low-risk pregnancy and has Supervision of other normal pregnancy, antepartum; Antepartum multigravida of advanced maternal age; and UTI (urinary tract infection) during pregnancy, third trimester on their problem list.  Patient reports no complaints.   Contractions: Not present. Vag. Bleeding: None.  Movement: Present. Denies leaking of fluid.   The following portions of the patient's history were reviewed and updated as appropriate: allergies, current medications, past family history, past medical history, past social history, past surgical history and problem list. Problem list updated.  Objective:   Vitals:   12/20/17 0949  BP: 118/65  Pulse: 83  Weight: 179 lb 3.2 oz (81.3 kg)    Fetal Status: Fetal Heart Rate (bpm): 140 Fundal Height: 32 cm Movement: Present     General:  Alert, oriented and cooperative. Patient is in no acute distress.  Skin: Skin is warm and dry. No rash noted.   Cardiovascular: Normal heart rate noted  Respiratory: Normal respiratory effort, no problems with respiration noted  Abdomen: Soft, gravid, appropriate for gestational age. Pain/Pressure: Present     Pelvic:  Cervical exam deferred        Extremities: Normal range of motion.  Edema: None  Mental Status: Normal mood and affect. Normal behavior. Normal judgment and thought content.   Urinalysis:      Assessment and Plan:  Pregnancy: [id number] at 3437w4d  1. Supervision of other normal pregnancy, antepartum Recommend start qday iron. ucx toc mid to late may. btl papers already signed.  Preterm labor symptoms and general obstetric precautions including but not limited to vaginal bleeding, contractions, leaking of fluid and fetal movement were reviewed in  detail with the patient. Please refer to After Visit Summary for other counseling recommendations.  Return in about 2 weeks (around 01/03/2018) for low risk ob.   [place] BingPickens, Marvelle Span, MD

## 2018-01-03 ENCOUNTER — Ambulatory Visit (INDEPENDENT_AMBULATORY_CARE_PROVIDER_SITE_OTHER): Payer: BLUE CROSS/BLUE SHIELD | Admitting: Student

## 2018-01-03 VITALS — BP 107/76 | HR 85 | Wt 180.0 lb

## 2018-01-03 DIAGNOSIS — Z029 Encounter for administrative examinations, unspecified: Secondary | ICD-10-CM

## 2018-01-03 DIAGNOSIS — O09529 Supervision of elderly multigravida, unspecified trimester: Secondary | ICD-10-CM

## 2018-01-03 DIAGNOSIS — O2343 Unspecified infection of urinary tract in pregnancy, third trimester: Secondary | ICD-10-CM

## 2018-01-03 DIAGNOSIS — Z3480 Encounter for supervision of other normal pregnancy, unspecified trimester: Secondary | ICD-10-CM

## 2018-01-03 NOTE — Patient Instructions (Signed)

## 2018-01-03 NOTE — Progress Notes (Signed)
   PRENATAL VISIT NOTE  Subjective:  Melinda Simmons is a 42 y.o. [id number] at [redacted]w[redacted]d being seen today for ongoing prenatal care.  She is currently monitored for the following issues for this low-risk pregnancy and has Supervision of other normal pregnancy, antepartum; Antepartum multigravida of advanced maternal age; and UTI (urinary tract infection) during pregnancy, third trimester on their problem list.  Patient reports no complaints.  Took her antibiotics for UTI.  Contractions: Not present. Vag. Bleeding: None.  Movement: Present. Denies leaking of fluid.   The following portions of the patient's history were reviewed and updated as appropriate: allergies, current medications, past family history, past medical history, past social history, past surgical history and problem list. Problem list updated.  Objective:   Vitals:   01/03/18 1424  BP: 107/76  Pulse: 85  Weight: 180 lb (81.6 kg)    Fetal Status: Fetal Heart Rate (bpm): 138 Fundal Height: 35 cm Movement: Present     General:  Alert, oriented and cooperative. Patient is in no acute distress.  Skin: Skin is warm and dry. No rash noted.   Cardiovascular: Normal heart rate noted  Respiratory: Normal respiratory effort, no problems with respiration noted  Abdomen: Soft, gravid, appropriate for gestational age.  Pain/Pressure: Present     Pelvic: Cervical exam deferred        Extremities: Normal range of motion.  Edema: None  Mental Status: Normal mood and affect. Normal behavior. Normal judgment and thought content.   Assessment and Plan:  Pregnancy: [id number] at [redacted]w[redacted]d  1. Supervision of other normal pregnancy, antepartum  - [place] MFM OB FOLLOW UP; Future  2. Antepartum multigravida of advanced maternal age Doing well; no complaints.   3. UTI (urinary tract infection) during pregnancy, third trimester Finished antibiotics; needs TOC at 36 week visit.   Preterm labor symptoms and general obstetric precautions including  but not limited to vaginal bleeding, contractions, leaking of fluid and fetal movement were reviewed in detail with the patient. Please refer to After Visit Summary for other counseling recommendations.  Return in about 2 weeks (around 01/17/2018).  No future appointments.  Marylene Land, CNM

## 2018-01-04 ENCOUNTER — Telehealth: Payer: Self-pay

## 2018-01-04 NOTE — Telephone Encounter (Signed)
Called pt and confirmed with pt that I was informed she needed a work restriction letter.  Pt stated "yes, the work restriction letter that I turned in was from back in December and my manager is wanting to have an updated letter."  I explained to the pt that I can provide another general work restriction letter with today's date.  Pt stated yes that is what she needed.  I informed pt that she can come on Monday to pick up the letter.  Pt stated understanding.

## 2018-01-14 ENCOUNTER — Telehealth: Payer: Self-pay | Admitting: *Deleted
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
## 2018-01-16 ENCOUNTER — Ambulatory Visit (HOSPITAL_COMMUNITY)
Admission: RE | Admit: 2018-01-16 | Discharge: 2018-01-16 | Disposition: A | Discharge status: Home / Self Care | Payer: BLUE CROSS/BLUE SHIELD | Source: Ambulatory Visit | Attending: Student | Admitting: Student

## 2018-01-16 DIAGNOSIS — Z362 Encounter for other antenatal screening follow-up: Secondary | ICD-10-CM | POA: Insufficient documentation

## 2018-01-16 DIAGNOSIS — Z3480 Encounter for supervision of other normal pregnancy, unspecified trimester: Secondary | ICD-10-CM

## 2018-01-16 DIAGNOSIS — Z3A36 36 weeks gestation of pregnancy: Secondary | ICD-10-CM | POA: Diagnosis not present

## 2018-01-16 DIAGNOSIS — O99333 Smoking (tobacco) complicating pregnancy, third trimester: Secondary | ICD-10-CM | POA: Diagnosis not present

## 2018-01-16 DIAGNOSIS — O09523 Supervision of elderly multigravida, third trimester: Secondary | ICD-10-CM | POA: Diagnosis not present

## 2018-01-17 ENCOUNTER — Ambulatory Visit (INDEPENDENT_AMBULATORY_CARE_PROVIDER_SITE_OTHER): Payer: BLUE CROSS/BLUE SHIELD | Admitting: Student

## 2018-01-17 ENCOUNTER — Other Ambulatory Visit (HOSPITAL_COMMUNITY)
Admission: RE | Admit: 2018-01-17 | Discharge: 2018-01-17 | Disposition: A | Discharge status: Home / Self Care | Payer: BLUE CROSS/BLUE SHIELD | Source: Ambulatory Visit | Attending: Student | Admitting: Student

## 2018-01-17 VITALS — BP 127/84 | HR 81 | Wt 187.1 lb

## 2018-01-17 DIAGNOSIS — Z3480 Encounter for supervision of other normal pregnancy, unspecified trimester: Secondary | ICD-10-CM

## 2018-01-17 DIAGNOSIS — Z3483 Encounter for supervision of other normal pregnancy, third trimester: Secondary | ICD-10-CM

## 2018-01-17 DIAGNOSIS — O2343 Unspecified infection of urinary tract in pregnancy, third trimester: Secondary | ICD-10-CM

## 2018-01-17 LAB — OB RESULTS CONSOLE GBS: GBS: POSITIVE

## 2018-01-17 LAB — OB RESULTS CONSOLE GC/CHLAMYDIA: Gonorrhea: NEGATIVE

## 2018-01-17 NOTE — Progress Notes (Signed)
Ptv states left ear has been bothering her would like for that to be checked.

## 2018-01-17 NOTE — Progress Notes (Signed)
Patient ID: Melinda Simmons, female   DOB: [birth year], 42 y.o.   MRN: [id number]    PRENATAL VISIT NOTE  Subjective:  Melinda Simmons is a 42 y.o. [id number] at [redacted]w[redacted]d being seen today for ongoing prenatal care.  She is currently monitored for the following issues for this low-risk pregnancy and has Supervision of other normal pregnancy, antepartum; Antepartum multigravida of advanced maternal age; and UTI (urinary tract infection) during pregnancy, third trimester on their problem list.  Patient reports no complaints.  Contractions: Irritability. Vag. Bleeding: None.  Movement: Present. Denies leaking of fluid.   The following portions of the patient's history were reviewed and updated as appropriate: allergies, current medications, past family history, past medical history, past social history, past surgical history and problem list. Problem list updated.  Objective:   Vitals:   01/17/18 1019  BP: 127/84  Pulse: 81  Weight: 187 lb 1.6 oz (84.9 kg)    Fetal Status: Fetal Heart Rate (bpm): 140 Fundal Height: 36 cm Movement: Present  Presentation: Vertex  General:  Alert, oriented and cooperative. Patient is in no acute distress.  Skin: Skin is warm and dry. No rash noted.   Cardiovascular: Normal heart rate noted  Respiratory: Normal respiratory effort, no problems with respiration noted  Abdomen: Soft, gravid, appropriate for gestational age.  Pain/Pressure: Present     Pelvic: Cervical exam deferred       vertex  Extremities: Normal range of motion.  Edema: Trace  Mental Status: Normal mood and affect. Normal behavior. Normal judgment and thought content.   Assessment and Plan:  Pregnancy: [id number] at [redacted]w[redacted]d  1. UTI (urinary tract infection) during pregnancy, third trimester No signs and symptoms; completed her course as prescribed.  - Culture, OB Urine  2. Supervision of other normal pregnancy, antepartum -Doing well, no complaints.  - Culture, beta strep (group b  only) - GC/Chlamydia probe amp ([place])not at [facility]  Term labor symptoms and general obstetric precautions including but not limited to vaginal bleeding, contractions, leaking of fluid and fetal movement were reviewed in detail with the patient. Please refer to After Visit Summary for other counseling recommendations.  No follow-ups on file.  Future Appointments  Date Time Provider Department Center  01/24/2018  2:15 PM Judeth Horn, NP [facility]  01/31/2018  9:55 AM Marvetta Gibbons Brand Males, NP [facility]  02/07/2018  9:15 AM Judeth Horn, NP [facility]  02/14/2018 10:15 AM Calvert Cantor, CNM [facility]    Melinda Simmons, [place]

## 2018-01-18 LAB — GC/CHLAMYDIA PROBE AMP (~~LOC~~) NOT AT ARMC
CHLAMYDIA, DNA PROBE: NEGATIVE
NEISSERIA GONORRHEA: NEGATIVE

## 2018-01-18 NOTE — Telephone Encounter (Signed)
Patient has been evaluated for her issue.

## 2018-01-19 LAB — URINE CULTURE, OB REFLEX

## 2018-01-19 LAB — CULTURE, OB URINE

## 2018-01-20 LAB — CULTURE, BETA STREP (GROUP B ONLY): Strep Gp B Culture: POSITIVE — AB

## 2018-01-23 ENCOUNTER — Encounter: Payer: Self-pay | Admitting: Student

## 2018-01-24 ENCOUNTER — Encounter: Payer: Self-pay | Admitting: Student

## 2018-01-30 ENCOUNTER — Inpatient Hospital Stay (HOSPITAL_COMMUNITY)
Admission: AD | Admit: 2018-01-30 | Discharge: 2018-02-01 | DRG: 798 | Disposition: A | Discharge status: Home / Self Care | Payer: BLUE CROSS/BLUE SHIELD | Attending: Obstetrics and Gynecology | Admitting: Obstetrics and Gynecology

## 2018-01-30 ENCOUNTER — Encounter (HOSPITAL_COMMUNITY): Payer: Self-pay | Admitting: *Deleted

## 2018-01-30 ENCOUNTER — Inpatient Hospital Stay (HOSPITAL_COMMUNITY): Payer: BLUE CROSS/BLUE SHIELD | Admitting: Anesthesiology

## 2018-01-30 ENCOUNTER — Other Ambulatory Visit: Payer: Self-pay

## 2018-01-30 DIAGNOSIS — Z3A38 38 weeks gestation of pregnancy: Secondary | ICD-10-CM

## 2018-01-30 DIAGNOSIS — O99824 Streptococcus B carrier state complicating childbirth: Secondary | ICD-10-CM | POA: Diagnosis present

## 2018-01-30 DIAGNOSIS — O99334 Smoking (tobacco) complicating childbirth: Secondary | ICD-10-CM | POA: Diagnosis present

## 2018-01-30 DIAGNOSIS — O133 Gestational [pregnancy-induced] hypertension without significant proteinuria, third trimester: Secondary | ICD-10-CM

## 2018-01-30 DIAGNOSIS — F17210 Nicotine dependence, cigarettes, uncomplicated: Secondary | ICD-10-CM | POA: Diagnosis present

## 2018-01-30 DIAGNOSIS — Z3480 Encounter for supervision of other normal pregnancy, unspecified trimester: Secondary | ICD-10-CM

## 2018-01-30 DIAGNOSIS — O134 Gestational [pregnancy-induced] hypertension without significant proteinuria, complicating childbirth: Principal | ICD-10-CM | POA: Diagnosis present

## 2018-01-30 DIAGNOSIS — Z302 Encounter for sterilization: Secondary | ICD-10-CM | POA: Diagnosis not present

## 2018-01-30 LAB — COMPREHENSIVE METABOLIC PANEL
ALT: 10 U/L — ABNORMAL LOW (ref 14–54)
AST: 20 U/L (ref 15–41)
Albumin: 3 g/dL — ABNORMAL LOW (ref 3.5–5.0)
Alkaline Phosphatase: 216 U/L — ABNORMAL HIGH (ref 38–126)
Anion gap: 9 (ref 5–15)
BUN: 8 mg/dL (ref 6–20)
CO2: 16 mmol/L — ABNORMAL LOW (ref 22–32)
Calcium: 9.1 mg/dL (ref 8.9–10.3)
Chloride: 110 mmol/L (ref 101–111)
Creatinine, Ser: 0.66 mg/dL (ref 0.44–1.00)
GFR calc Af Amer: >60 mL/min (ref >60)
GFR calc non Af Amer: >60 mL/min (ref >60)
Glucose, Bld: 75 mg/dL (ref 65–99)
Potassium: 3.9 mmol/L (ref 3.5–5.1)
Sodium: 135 mmol/L (ref 135–145)
Total Bilirubin: 0.5 mg/dL (ref 0.3–1.2)
Total Protein: 6.7 g/dL (ref 6.5–8.1)

## 2018-01-30 LAB — CBC
HCT: 34.4 % — ABNORMAL LOW (ref 36.0–46.0)
HCT: 32.6 % — ABNORMAL LOW (ref 36.0–46.0)
Hemoglobin: 11.6 g/dL — ABNORMAL LOW (ref 12.0–15.0)
Hemoglobin: 10.8 g/dL — ABNORMAL LOW (ref 12.0–15.0)
MCH: 29.4 pg (ref 26.0–34.0)
MCH: 29.3 pg (ref 26.0–34.0)
MCHC: 33.7 g/dL (ref 30.0–36.0)
MCHC: 33.1 g/dL (ref 30.0–36.0)
MCV: 87.3 fL (ref 78.0–100.0)
MCV: 88.6 fL (ref 78.0–100.0)
PLATELETS: 197 10*3/uL (ref 150–400)
Platelets: 204 10*3/uL (ref 150–400)
RBC: 3.94 MIL/uL (ref 3.87–5.11)
RBC: 3.68 MIL/uL — ABNORMAL LOW (ref 3.87–5.11)
RDW: 16.5 % — ABNORMAL HIGH (ref 11.5–15.5)
RDW: 16.5 % — AB (ref 11.5–15.5)
WBC: 11.8 10*3/uL — ABNORMAL HIGH (ref 4.0–10.5)
WBC: 12.4 10*3/uL — AB (ref 4.0–10.5)

## 2018-01-30 LAB — URINALYSIS, ROUTINE W REFLEX MICROSCOPIC
Bilirubin Urine: NEGATIVE
Glucose, UA: NEGATIVE mg/dL
Hgb urine dipstick: NEGATIVE
Ketones, ur: NEGATIVE mg/dL
Nitrite: NEGATIVE
Protein, ur: NEGATIVE mg/dL
Specific Gravity, Urine: 1.005 (ref 1.005–1.030)
pH: 6 (ref 5.0–8.0)

## 2018-01-30 LAB — PROTEIN / CREATININE RATIO, URINE
Creatinine, Urine: 44 mg/dL
Protein Creatinine Ratio: 0.16 mg/mg{Cre} — ABNORMAL HIGH (ref 0.00–0.15)
Total Protein, Urine: 7 mg/dL

## 2018-01-30 LAB — TYPE AND SCREEN
ABO/RH(D): O POS
Antibody Screen: NEGATIVE

## 2018-01-30 LAB — RPR: RPR Ser Ql: NONREACTIVE

## 2018-01-30 MED ORDER — LIDOCAINE HCL (PF) 1 % IJ SOLN
INTRAMUSCULAR | Status: DC | PRN
  Administered 2018-01-30: 8 mL via EPIDURAL

## 2018-01-30 MED ORDER — FENTANYL 2.5 MCG/ML BUPIVACAINE 1/10 % EPIDURAL INFUSION (WH - ANES)
14.0000 mL/h | INTRAMUSCULAR | Status: DC | PRN
  Administered 2018-01-30: 14 mL/h via EPIDURAL
  Filled 2018-01-30: qty 100

## 2018-01-30 MED ORDER — LACTATED RINGERS IV BOLUS
1000.0000 mL | Freq: Once | INTRAVENOUS | Status: AC
  Administered 2018-01-30: 1000 mL via INTRAVENOUS

## 2018-01-30 MED ORDER — ONDANSETRON HCL 4 MG/2ML IJ SOLN: 4.0000 mg | Freq: Four times a day (QID) | INTRAMUSCULAR | Status: DC | PRN

## 2018-01-30 MED ORDER — DIPHENHYDRAMINE HCL 50 MG/ML IJ SOLN
25.0000 mg | Freq: Once | INTRAMUSCULAR | Status: AC
  Administered 2018-01-30: 25 mg via INTRAVENOUS
  Filled 2018-01-30: qty 1

## 2018-01-30 MED ORDER — PHENYLEPHRINE 40 MCG/ML (10ML) SYRINGE FOR IV PUSH (FOR BLOOD PRESSURE SUPPORT)
80.0000 ug | PREFILLED_SYRINGE | INTRAVENOUS | Status: DC | PRN
  Filled 2018-01-30: qty 5

## 2018-01-30 MED ORDER — FENTANYL CITRATE (PF) 100 MCG/2ML IJ SOLN: 100.0000 ug | INTRAMUSCULAR | Status: DC | PRN

## 2018-01-30 MED ORDER — DIPHENHYDRAMINE HCL 25 MG PO CAPS: 25.0000 mg | ORAL_CAPSULE | Freq: Four times a day (QID) | ORAL | Status: DC | PRN

## 2018-01-30 MED ORDER — SOD CITRATE-CITRIC ACID 500-334 MG/5ML PO SOLN: 30.0000 mL | ORAL | Status: DC | PRN

## 2018-01-30 MED ORDER — OXYCODONE-ACETAMINOPHEN 5-325 MG PO TABS: 2.0000 | ORAL_TABLET | ORAL | Status: DC | PRN

## 2018-01-30 MED ORDER — COCONUT OIL OIL: 1.0000 "application " | TOPICAL_OIL | Status: DC | PRN

## 2018-01-30 MED ORDER — DIPHENHYDRAMINE HCL 50 MG/ML IJ SOLN: 12.5000 mg | INTRAMUSCULAR | Status: DC | PRN

## 2018-01-30 MED ORDER — ACETAMINOPHEN 325 MG PO TABS
650.0000 mg | ORAL_TABLET | ORAL | Status: DC | PRN
  Administered 2018-01-30 – 2018-02-01 (×7): 650 mg via ORAL
  Filled 2018-01-30 (×7): qty 2

## 2018-01-30 MED ORDER — ONDANSETRON HCL 4 MG/2ML IJ SOLN: 4.0000 mg | INTRAMUSCULAR | Status: DC | PRN

## 2018-01-30 MED ORDER — TERBUTALINE SULFATE 1 MG/ML IJ SOLN
0.2500 mg | Freq: Once | INTRAMUSCULAR | Status: DC | PRN
  Filled 2018-01-30: qty 1

## 2018-01-30 MED ORDER — LACTATED RINGERS IV SOLN
500.0000 mL | Freq: Once | INTRAVENOUS | Status: AC
  Administered 2018-01-30: 500 mL via INTRAVENOUS

## 2018-01-30 MED ORDER — TETANUS-DIPHTH-ACELL PERTUSSIS 5-2.5-18.5 LF-MCG/0.5 IM SUSP: 0.5000 mL | Freq: Once | INTRAMUSCULAR | Status: DC

## 2018-01-30 MED ORDER — OXYCODONE-ACETAMINOPHEN 5-325 MG PO TABS: 1.0000 | ORAL_TABLET | ORAL | Status: DC | PRN

## 2018-01-30 MED ORDER — EPHEDRINE 5 MG/ML INJ
10.0000 mg | INTRAVENOUS | Status: DC | PRN
  Filled 2018-01-30: qty 2

## 2018-01-30 MED ORDER — OXYTOCIN BOLUS FROM INFUSION
500.0000 mL | Freq: Once | INTRAVENOUS | Status: AC
  Administered 2018-01-30: 500 mL via INTRAVENOUS

## 2018-01-30 MED ORDER — METOCLOPRAMIDE HCL 5 MG/ML IJ SOLN
10.0000 mg | Freq: Once | INTRAMUSCULAR | Status: AC
  Administered 2018-01-30: 10 mg via INTRAVENOUS
  Filled 2018-01-30: qty 2

## 2018-01-30 MED ORDER — ZOLPIDEM TARTRATE 5 MG PO TABS
5.0000 mg | ORAL_TABLET | Freq: Every evening | ORAL | Status: DC | PRN
Start: 2018-01-30 — End: 2018-02-01

## 2018-01-30 MED ORDER — DIBUCAINE 1 % RE OINT: 1.0000 "application " | TOPICAL_OINTMENT | RECTAL | Status: DC | PRN

## 2018-01-30 MED ORDER — OXYTOCIN 40 UNITS IN LACTATED RINGERS INFUSION - SIMPLE MED
1.0000 m[IU]/min | INTRAVENOUS | Status: DC
  Administered 2018-01-30: 2 m[IU]/min via INTRAVENOUS

## 2018-01-30 MED ORDER — ONDANSETRON HCL 4 MG PO TABS: 4.0000 mg | ORAL_TABLET | ORAL | Status: DC | PRN

## 2018-01-30 MED ORDER — BENZOCAINE-MENTHOL 20-0.5 % EX AERO
1.0000 "application " | INHALATION_SPRAY | CUTANEOUS | Status: DC | PRN
  Administered 2018-01-30: 1 via TOPICAL
  Filled 2018-01-30: qty 56

## 2018-01-30 MED ORDER — LACTATED RINGERS IV SOLN
INTRAVENOUS | Status: DC
  Administered 2018-01-30: 05:00:00 via INTRAVENOUS

## 2018-01-30 MED ORDER — ACETAMINOPHEN 325 MG PO TABS
650.0000 mg | ORAL_TABLET | ORAL | Status: DC | PRN
  Administered 2018-01-30: 650 mg via ORAL
  Filled 2018-01-30: qty 2

## 2018-01-30 MED ORDER — PRENATAL MULTIVITAMIN CH
1.0000 | ORAL_TABLET | Freq: Every day | ORAL | Status: DC
  Administered 2018-02-01: 1 via ORAL
  Filled 2018-01-30: qty 1

## 2018-01-30 MED ORDER — PENICILLIN G POT IN DEXTROSE 60000 UNIT/ML IV SOLN
3.0000 10*6.[IU] | INTRAVENOUS | Status: DC
  Administered 2018-01-30 (×2): 3 10*6.[IU] via INTRAVENOUS
  Filled 2018-01-30 (×5): qty 50

## 2018-01-30 MED ORDER — LACTATED RINGERS IV SOLN: 500.0000 mL | INTRAVENOUS | Status: DC | PRN

## 2018-01-30 MED ORDER — SENNOSIDES-DOCUSATE SODIUM 8.6-50 MG PO TABS
2.0000 | ORAL_TABLET | ORAL | Status: DC
  Administered 2018-01-31 (×2): 2 via ORAL
  Filled 2018-01-30 (×2): qty 2

## 2018-01-30 MED ORDER — DEXAMETHASONE SODIUM PHOSPHATE 10 MG/ML IJ SOLN
10.0000 mg | Freq: Once | INTRAMUSCULAR | Status: AC
  Administered 2018-01-30: 10 mg via INTRAVENOUS
  Filled 2018-01-30: qty 1

## 2018-01-30 MED ORDER — OXYTOCIN 40 UNITS IN LACTATED RINGERS INFUSION - SIMPLE MED
INTRAVENOUS | Status: AC
  Filled 2018-01-30: qty 1000

## 2018-01-30 MED ORDER — WITCH HAZEL-GLYCERIN EX PADS: 1.0000 "application " | MEDICATED_PAD | CUTANEOUS | Status: DC | PRN

## 2018-01-30 MED ORDER — PHENYLEPHRINE 40 MCG/ML (10ML) SYRINGE FOR IV PUSH (FOR BLOOD PRESSURE SUPPORT)
80.0000 ug | PREFILLED_SYRINGE | INTRAVENOUS | Status: DC | PRN
  Filled 2018-01-30: qty 10
  Filled 2018-01-30: qty 5

## 2018-01-30 MED ORDER — OXYTOCIN 40 UNITS IN LACTATED RINGERS INFUSION - SIMPLE MED: 2.5000 [IU]/h | INTRAVENOUS | Status: DC

## 2018-01-30 MED ORDER — LIDOCAINE HCL (PF) 1 % IJ SOLN
30.0000 mL | INTRAMUSCULAR | Status: DC | PRN
  Filled 2018-01-30: qty 30

## 2018-01-30 MED ORDER — SIMETHICONE 80 MG PO CHEW: 80.0000 mg | CHEWABLE_TABLET | ORAL | Status: DC | PRN

## 2018-01-30 MED ORDER — SODIUM CHLORIDE 0.9 % IV SOLN
5.0000 10*6.[IU] | Freq: Once | INTRAVENOUS | Status: AC
  Administered 2018-01-30: 5 10*6.[IU] via INTRAVENOUS
  Filled 2018-01-30: qty 5

## 2018-01-30 NOTE — Anesthesia Postprocedure Evaluation (Signed)
Anesthesia Post Note  Patient: Melinda Simmons  Procedure(s) Performed: AN AD HOC LABOR EPIDURAL     Patient location during evaluation: Mother Baby Anesthesia Type: Epidural Level of consciousness: awake and alert Pain management: pain level controlled Vital Signs Assessment: post-procedure vital signs reviewed and stable Respiratory status: spontaneous breathing, nonlabored ventilation and respiratory function stable Cardiovascular status: stable Postop Assessment: no headache, no backache and epidural receding Anesthetic complications: no    Last Vitals:  Vitals:   01/30/18 1630 01/30/18 1700  BP: 117/71 (!) 120/54  Pulse: 99 87  Resp:  16  Temp:    SpO2:      Last Pain:  Vitals:   01/30/18 1602  TempSrc: Axillary  PainSc:    Pain Goal:                 Laird Runnion

## 2018-01-30 NOTE — Anesthesia Pain Management Evaluation Note (Signed)
  CRNA Pain Management Visit Note  Patient: Melinda Simmons, 42 y.o., female  "Hello I am a member of the anesthesia team at [facility]. We have an anesthesia team available at all times to provide care throughout the hospital, including epidural management and anesthesia for C-section. I don't know your plan for the delivery whether it a natural birth, water birth, IV sedation, nitrous supplementation, doula or epidural, but we want to meet your pain goals."   1.Was your pain managed to your expectations on prior hospitalizations?   Yes   2.What is your expectation for pain management during this hospitalization?     Epidural  3.How can we help you reach that goal?   Record the patient's initial score and the patient's pain goal.   Pain: 4  Pain Goal: 8 [facility] wants you to be able to say your pain was always managed very well.  Laban EmperorMalinova,Iliany Losier Hristova 01/30/2018

## 2018-01-30 NOTE — Progress Notes (Signed)
Labor Progress Note  Melinda Simmons is a 42 y.o. [id number] at 6931w3d  admitted for latent labor, with new-onset gHTN.  S: Doing well. Wanting epidural.  O:  BP 127/80   Pulse 79   Temp 98.4 F (36.9 C) (Oral)   Resp 18   Ht 5\' 4"  (1.626 m)   Wt 192 lb (87.1 kg)   LMP 05/06/2017 (Approximate)   BMI 32.96 kg/m   No intake/output data recorded.  FHT:  FHR: 135 bpm, variability: moderate,  accelerations:  Present,  decelerations:  Absent UC:   regular, every 2-4 minutes SVE:   Dilation: 3.5 Effacement (%): 50 Station: -3 Exam by:: [place]Brittany AROM clear at 11:45  Pitocin @ 14 mu/min  Labs: Lab Results  Component Value Date   WBC 11.8 (H) 01/30/2018   HGB 11.6 (L) 01/30/2018   HCT 34.4 (L) 01/30/2018   MCV 87.3 01/30/2018   PLT 204 01/30/2018    Assessment / Plan: 42 y.o. [id number] 6231w3d not in labor.  Induction of labor due to gestational hypertension,  progressing well on pitocin  Labor: on pitocin, s/p AROM clear 11:45, not yet in active labor  gHTN:  BPs normotensive and stable.  Fetal Wellbeing:  Category I Pain Control:  desires epidural Anticipated MOD:  NSVD  Expectant management   Caryl AdaJazma Roopa Graver, DO OB Fellow Center for [facility], [facility]

## 2018-01-30 NOTE — MAU Note (Signed)
PT SAYS  PAIN IN RIGHT SIDE AT 10PM-.   HAS BP CUFF AT HOME- 133/99.   H/A STARTED  AT [place]-   NO MEDS  NO BLURRED VISION- FEELS LIGHT  HEADED.   NO HX  OF HIGH BP.   FEELS  UC-  SINCE MN.  GBS- POSITIVE . NO VE IN CLINIC.  DENIES  HSV AND MRSA

## 2018-01-30 NOTE — H&P (Signed)
LABOR AND DELIVERY ADMISSION HISTORY AND PHYSICAL NOTE  Melinda Simmons is a 42 y.o. female [phone] with IUP at 872w3d by LMP who presents to MAU for abdominal pain, HA, and elevated BP. She reports having a BP cuff at home and her BP being 133/99- she denies hx of hypertension during this pregnancy. She reports associated symptoms of RUQ abdominal pain and HA. She denies vision changes or increased swelling. She reports abdominal pain that started around 2200. She reports waking up out of her sleep to sharp stabbing upper abdominal pain. She rates abdominal pain 9/10- has not taken any medication for pain. She rates HA 7/10 - has not taken any medication at home for treatment prior to coming to MAU. She reports positive fetal movement. She denies leakage of fluid or vaginal bleeding. She reports occasional contractions that occur every 8-10 minutes.   Prenatal History/Complications: [facility] Pregnancy complications:  - Antepartum multigravida of advanced maternal age   Past Medical History: Past Medical History:  Diagnosis Date  . Abnormal Pap smear    cryo, rpt wnl  . Chlamydia   . Gonorrhea   . Urinary tract infection     Past Surgical History: Past Surgical History:  Procedure Laterality Date  . THERAPEUTIC ABORTION      Obstetrical History: OB History    Gravida  6   Para  3   Term  3   Preterm  0   AB  2   Living  3     SAB  0   TAB  2   Ectopic  0   Multiple  0   Live Births  3           Social History: Social History   Socioeconomic History  . Marital status: Single    Spouse name: Not on file  . Number of children: Not on file  . Years of education: Not on file  . Highest education level: Not on file  Occupational History  . Not on file  Social Needs  . Financial resource strain: Not on file  . Food insecurity:    Worry: Not on file    Inability: Not on file  . Transportation needs:    Medical: Not on file    Non-medical: Not on  file  Tobacco Use  . Smoking status: Current Every Day Smoker    Packs/day: 0.25    Years: 10.00    Pack years: 2.50    Types: Cigarettes  . Smokeless tobacco: Never Used  . Tobacco comment: cutting back  Substance and Sexual Activity  . Alcohol use: No  . 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 Sexual activity: Not Currently    Birth control/protection: None  Lifestyle  . Physical activity:    Days per week: Not on file    Minutes per session: Not on file  . Stress: Not on file  Relationships  . Social connections:    Talks on phone: Not on file    Gets together: Not on file    Attends religious service: Not on file    Active member of club or organization: Not on file    Attends meetings of clubs or organizations: Not on file    Relationship status: Not on file  Other Topics Concern  . Not on file  Social History Narrative  . Not on file    Family History: Family  History  Problem Relation Age of Onset  . Hypertension Mother   . Stomach cancer Father   . Asthma Father   . Bronchitis Father   . Anesthesia problems Neg Hx     Allergies: Allergies  Allergen Reactions  . Motrin [Ibuprofen] Other (See Comments)    Causes Bleeding.    Medications Prior to Admission  Medication Sig Dispense Refill Last Dose  . ferrous gluconate (FERGON) 324 MG tablet Take 1 tablet (324 mg total) by mouth daily with breakfast. 60 tablet 1 Taking  . flintstones complete (FLINTSTONES) 60 MG chewable tablet Chew 2 tablets by mouth daily.   Taking     Review of Systems  All systems reviewed and negative except as stated in HPI  Physical Exam Vitals:   01/30/18 0230 01/30/18 0245 01/30/18 0300 01/30/18 0320  BP: (!) 146/93 (!) 141/78 126/75 (!) 150/105  Pulse: 88 88 79 87  Resp:      Temp:      TempSrc:      Weight:      Height:       General appearance: alert, cooperative and mild distress Lungs: clear to auscultation  bilaterally Heart: regular rate and rhythm Abdomen: soft, non-tender; bowel sounds normal, gravid appropriate for gestational age Extremities: No calf swelling or tenderness Presentation: cephalic by SVE Fetal monitoring: 135/ moderate variability/ +accelerations/ no decels  Uterine activity: 3-4 minutes/ mild by palpation  Dilation: 3 Effacement (%): 50 Station: -3 Exam by:: Lanice Shirts CNM   Prenatal labs: ABO, Rh: O/Positive/-- (12/14 1007) Antibody: Negative (12/14 1007) Rubella: 4.09 (12/14 1007) RPR: Non Reactive (04/11 1030)  HBsAg: Negative (12/14 1007)  HIV: Non Reactive (04/11 1030)  GC/Chlamydia: Negative (5/23)  GBS:   Positive (5/23) 1 hr Glucola: 93 Genetic screening:  Low risk  Anatomy US: Normal female   Clinic  [facility] Prenatal Labs  Dating  LMP Blood type: O/Positive/-- (12/14 1007)   Genetic Screen NIPS: low risks Antibody:Negative (12/14 1007)  Anatomic [place]  incomplete on f/u on 10/22/17- offer 3rd ultrasound Rubella: 4.09 (12/14 1007)  GTT Third trimester: neg RPR: Non Reactive (12/14 1007)   Flu vaccine  Declines HBsAg: Negative (12/14 1007)   TDaP vaccine       12/06/17                                         HIV:  negative  Baby Food Bottle                            GBS: POSITIVE  Contraception BTL WUJ:[id number] cytology pos HR HPV repeat in 1 year  Circumcision Boy--wants inpatient circ   Pediatrician   [facility]:  Support Person   SMA reduced risk copy of 2  Prenatal Classes  offered Hgb electrophoresis:     Prenatal Transfer Tool  Maternal Diabetes: No Genetic Screening: Normal Maternal Ultrasounds/Referrals: Normal Fetal Ultrasounds or other Referrals:  None Maternal Substance Abuse:  No Significant Maternal Medications:  None Significant Maternal Lab Results: Lab values include: Group B Strep positive  Results for orders placed or performed during the hospital encounter of 01/30/18 (from the past 24 hour(s))  Urinalysis, Routine  w reflex microscopic   Collection Time: 01/30/18  1:11 AM  Result Value Ref Range   Color, Urine STRAW (A) YELLOW   APPearance  CLEAR CLEAR   Specific Gravity, Urine 1.005 1.005 - 1.030   pH 6.0 5.0 - 8.0   Glucose, UA NEGATIVE NEGATIVE mg/dL   Hgb urine dipstick NEGATIVE NEGATIVE   Bilirubin Urine NEGATIVE NEGATIVE   Ketones, ur NEGATIVE NEGATIVE mg/dL   Protein, ur NEGATIVE NEGATIVE mg/dL   Nitrite NEGATIVE NEGATIVE   Leukocytes, UA SMALL (A) NEGATIVE   RBC / HPF 0-5 0 - 5 RBC/hpf   WBC, UA 0-5 0 - 5 WBC/hpf   Bacteria, UA RARE (A) NONE SEEN   Squamous Epithelial / LPF 0-5 0 - 5  Protein / creatinine ratio, urine   Collection Time: 01/30/18  1:11 AM  Result Value Ref Range   Creatinine, Urine 44.00 mg/dL   Total Protein, Urine 7 mg/dL   Protein Creatinine Ratio 0.16 (H) 0.00 - 0.15 mg/mg[Cre]  CBC   Collection Time: 01/30/18  2:12 AM  Result Value Ref Range   WBC 11.8 (H) 4.0 - 10.5 K/uL   RBC 3.94 3.87 - 5.11 MIL/uL   Hemoglobin 11.6 (L) 12.0 - 15.0 g/dL   HCT 95.6 (L) 21.3 - 08.6 %   MCV 87.3 78.0 - 100.0 fL   MCH 29.4 26.0 - 34.0 pg   MCHC 33.7 30.0 - 36.0 g/dL   RDW 57.8 (H) 46.9 - 62.9 %   Platelets 204 150 - 400 K/uL  Comprehensive metabolic panel   Collection Time: 01/30/18  2:12 AM  Result Value Ref Range   Sodium 135 135 - 145 mmol/L   Potassium 3.9 3.5 - 5.1 mmol/L   Chloride 110 101 - 111 mmol/L   CO2 16 (L) 22 - 32 mmol/L   Glucose, Bld 75 65 - 99 mg/dL   BUN 8 6 - 20 mg/dL   Creatinine, Ser 5.28 0.44 - 1.00 mg/dL   Calcium 9.1 8.9 - 41.3 mg/dL   Total Protein 6.7 6.5 - 8.1 g/dL   Albumin 3.0 (L) 3.5 - 5.0 g/dL   AST 20 15 - 41 U/L   ALT 10 (L) 14 - 54 U/L   Alkaline Phosphatase 216 (H) 38 - 126 U/L   Total Bilirubin 0.5 0.3 - 1.2 mg/dL   GFR calc non Af Amer >60 >60 mL/min   GFR calc Af Amer >60 >60 mL/min   Anion gap 9 5 - 15    Patient Active Problem List   Diagnosis Date Noted  . UTI (urinary tract infection) during pregnancy, third  trimester 12/05/2017  . Supervision of other normal pregnancy, antepartum 08/10/2017  . Antepartum multigravida of advanced maternal age 46/14/2018   Assessment: Melinda Simmons is a 42 y.o. [id number] at [redacted]w[redacted]d here for IOL for gestational hypertension. PreE labs WNL.   #Labor: IOL with pitocin #Pain: Medications ordered PRN  #FWB: Cat I #ID:  GBS Pos  #MOF: Bottle   #MOC:BTL- papers signed 4/11 #Circ:  Yes, inpatient   Sharyon Cable, CNM 01/30/2018, 3:34 AM

## 2018-01-30 NOTE — Anesthesia Procedure Notes (Signed)
Epidural Patient location during procedure: OB Start time: 01/30/2018 12:20 PM End time: 01/30/2018 12:25 PM  Staffing Anesthesiologist: Bethena Midgetddono, Chas Axel, MD  Preanesthetic Checklist Completed: patient identified, site marked, surgical consent, pre-op evaluation, timeout performed, IV checked, risks and benefits discussed and monitors and equipment checked  Epidural Patient position: sitting Prep: site prepped and draped and DuraPrep Patient monitoring: continuous pulse ox and blood pressure Approach: midline Injection technique: LOR air  Needle:  Needle type: Tuohy  Needle gauge: 17 G Needle length: 9 cm and 9 Needle insertion depth: 6 cm Catheter type: closed end flexible Catheter size: 19 Gauge Catheter at skin depth: 11 cm Test dose: negative  Assessment Events: blood not aspirated, injection not painful, no injection resistance, negative IV test and no paresthesia

## 2018-01-30 NOTE — Anesthesia Preprocedure Evaluation (Signed)
Anesthesia Evaluation  Patient identified by MRN, date of birth, ID band Patient awake    Reviewed: Allergy & Precautions, H&P , NPO status , Patient's Chart, lab work & pertinent test results, reviewed documented beta blocker date and time   Airway Mallampati: II  TM Distance: >3 FB Neck ROM: full    Dental no notable dental hx.    Pulmonary neg pulmonary ROS, Current Smoker,    Pulmonary exam normal breath sounds clear to auscultation       Cardiovascular hypertension, Normal cardiovascular exam Rhythm:regular Rate:Normal     Neuro/Psych negative neurological ROS  negative psych ROS   GI/Hepatic negative GI ROS, Neg liver ROS,   Endo/Other  negative endocrine ROS  Renal/GU negative Renal ROS  negative genitourinary   Musculoskeletal   Abdominal   Peds  Hematology negative hematology ROS (+)   Anesthesia Other Findings   Reproductive/Obstetrics (+) Pregnancy                             Anesthesia Physical Anesthesia Plan  ASA: III  Anesthesia Plan: Epidural   Post-op Pain Management:    Induction:   PONV Risk Score and Plan:   Airway Management Planned:   Additional Equipment:   Intra-op Plan:   Post-operative Plan:   Informed Consent: I have reviewed the patients History and Physical, chart, labs and discussed the procedure including the risks, benefits and alternatives for the proposed anesthesia with the patient or authorized representative who has indicated his/her understanding and acceptance.     Plan Discussed with:   Anesthesia Plan Comments:         Anesthesia Quick Evaluation

## 2018-01-31 ENCOUNTER — Encounter: Payer: Self-pay | Admitting: Nurse Practitioner

## 2018-01-31 ENCOUNTER — Encounter (HOSPITAL_COMMUNITY)
Admission: AD | Disposition: A | Discharge status: Home / Self Care | Payer: Self-pay | Source: Home / Self Care | Attending: Obstetrics and Gynecology

## 2018-01-31 ENCOUNTER — Inpatient Hospital Stay (HOSPITAL_COMMUNITY): Payer: BLUE CROSS/BLUE SHIELD | Admitting: Certified Registered Nurse Anesthetist

## 2018-01-31 ENCOUNTER — Encounter (HOSPITAL_COMMUNITY): Payer: Self-pay | Admitting: Obstetrics & Gynecology

## 2018-01-31 HISTORY — PX: TUBAL LIGATION: SHX77

## 2018-01-31 LAB — CBC
HEMATOCRIT: 30.3 % — AB (ref 36.0–46.0)
Hemoglobin: 10 g/dL — ABNORMAL LOW (ref 12.0–15.0)
MCH: 29.4 pg (ref 26.0–34.0)
MCHC: 33 g/dL (ref 30.0–36.0)
MCV: 89.1 fL (ref 78.0–100.0)
Platelets: 175 10*3/uL (ref 150–400)
RBC: 3.4 MIL/uL — AB (ref 3.87–5.11)
RDW: 16.7 % — ABNORMAL HIGH (ref 11.5–15.5)
WBC: 12.1 10*3/uL — AB (ref 4.0–10.5)

## 2018-01-31 SURGERY — LIGATION, FALLOPIAN TUBE, POSTPARTUM
Anesthesia: Epidural | Laterality: Bilateral | Wound class: Clean

## 2018-01-31 MED ORDER — FAMOTIDINE 20 MG PO TABS
40.0000 mg | ORAL_TABLET | Freq: Once | ORAL | Status: AC
  Administered 2018-01-31: 40 mg via ORAL
  Filled 2018-01-31: qty 2

## 2018-01-31 MED ORDER — OXYCODONE HCL 5 MG PO TABS
10.0000 mg | ORAL_TABLET | ORAL | Status: DC | PRN
  Administered 2018-02-01 (×2): 10 mg via ORAL
  Filled 2018-01-31 (×2): qty 2

## 2018-01-31 MED ORDER — MIDAZOLAM HCL 2 MG/2ML IJ SOLN
INTRAMUSCULAR | Status: AC
  Filled 2018-01-31: qty 2

## 2018-01-31 MED ORDER — OXYCODONE HCL 5 MG PO TABS: 5.0000 mg | ORAL_TABLET | Freq: Once | ORAL | Status: DC | PRN

## 2018-01-31 MED ORDER — OXYCODONE HCL 5 MG PO TABS
5.0000 mg | ORAL_TABLET | ORAL | Status: DC | PRN
  Administered 2018-01-31 – 2018-02-01 (×4): 5 mg via ORAL
  Filled 2018-01-31 (×4): qty 1

## 2018-01-31 MED ORDER — SUCCINYLCHOLINE CHLORIDE 200 MG/10ML IV SOSY
PREFILLED_SYRINGE | INTRAVENOUS | Status: AC
  Filled 2018-01-31: qty 10

## 2018-01-31 MED ORDER — FENTANYL CITRATE (PF) 100 MCG/2ML IJ SOLN: 25.0000 ug | INTRAMUSCULAR | Status: DC | PRN

## 2018-01-31 MED ORDER — ACETAMINOPHEN 160 MG/5ML PO SOLN: 325.0000 mg | ORAL | Status: DC | PRN

## 2018-01-31 MED ORDER — MEPERIDINE HCL 25 MG/ML IJ SOLN: 6.2500 mg | INTRAMUSCULAR | Status: DC | PRN

## 2018-01-31 MED ORDER — BUPIVACAINE HCL (PF) 0.25 % IJ SOLN
INTRAMUSCULAR | Status: AC
  Filled 2018-01-31: qty 30

## 2018-01-31 MED ORDER — LACTATED RINGERS IV SOLN
INTRAVENOUS | Status: DC
  Administered 2018-01-31: 20 mL via INTRAVENOUS

## 2018-01-31 MED ORDER — FENTANYL CITRATE (PF) 100 MCG/2ML IJ SOLN
INTRAMUSCULAR | Status: DC | PRN
  Administered 2018-01-31: 100 ug via INTRAVENOUS

## 2018-01-31 MED ORDER — MIDAZOLAM HCL 2 MG/2ML IJ SOLN
INTRAMUSCULAR | Status: DC | PRN
  Administered 2018-01-31 (×2): 1 mg via INTRAVENOUS

## 2018-01-31 MED ORDER — OXYCODONE HCL 5 MG/5ML PO SOLN: 5.0000 mg | Freq: Once | ORAL | Status: DC | PRN

## 2018-01-31 MED ORDER — LIDOCAINE-EPINEPHRINE (PF) 2 %-1:200000 IJ SOLN
INTRAMUSCULAR | Status: AC
  Filled 2018-01-31: qty 20

## 2018-01-31 MED ORDER — FENTANYL CITRATE (PF) 100 MCG/2ML IJ SOLN
INTRAMUSCULAR | Status: AC
  Filled 2018-01-31: qty 2

## 2018-01-31 MED ORDER — SUCCINYLCHOLINE CHLORIDE 20 MG/ML IJ SOLN
INTRAMUSCULAR | Status: DC | PRN
  Administered 2018-01-31: 120 mg via INTRAVENOUS

## 2018-01-31 MED ORDER — LIDOCAINE HCL (CARDIAC) PF 100 MG/5ML IV SOSY
PREFILLED_SYRINGE | INTRAVENOUS | Status: AC
  Filled 2018-01-31: qty 5

## 2018-01-31 MED ORDER — ACETAMINOPHEN 325 MG PO TABS: 325.0000 mg | ORAL_TABLET | ORAL | Status: DC | PRN

## 2018-01-31 MED ORDER — PROPOFOL 10 MG/ML IV BOLUS
INTRAVENOUS | Status: AC
  Filled 2018-01-31: qty 20

## 2018-01-31 MED ORDER — ONDANSETRON HCL 4 MG/2ML IJ SOLN: 4.0000 mg | Freq: Once | INTRAMUSCULAR | Status: DC | PRN

## 2018-01-31 MED ORDER — LIDOCAINE HCL (CARDIAC) PF 100 MG/5ML IV SOSY
PREFILLED_SYRINGE | INTRAVENOUS | Status: DC | PRN
  Administered 2018-01-31: 75 mg via INTRAVENOUS

## 2018-01-31 MED ORDER — PROPOFOL 10 MG/ML IV BOLUS
INTRAVENOUS | Status: DC | PRN
  Administered 2018-01-31: 200 mg via INTRAVENOUS

## 2018-01-31 MED ORDER — BUPIVACAINE HCL 0.5 % IJ SOLN
INTRAMUSCULAR | Status: DC | PRN
  Administered 2018-01-31: 20 mL

## 2018-01-31 MED ORDER — METOCLOPRAMIDE HCL 10 MG PO TABS
10.0000 mg | ORAL_TABLET | Freq: Once | ORAL | Status: AC
  Administered 2018-01-31: 10 mg via ORAL
  Filled 2018-01-31: qty 1

## 2018-01-31 MED ORDER — LACTATED RINGERS IV SOLN
INTRAVENOUS | Status: DC | PRN
  Administered 2018-01-31: 16:00:00 via INTRAVENOUS

## 2018-01-31 MED ORDER — SODIUM BICARBONATE 8.4 % IV SOLN
INTRAVENOUS | Status: DC | PRN
  Administered 2018-01-31 (×3): 5 mL via EPIDURAL

## 2018-01-31 SURGICAL SUPPLY — 26 items
ADH SKN CLS LQ APL DERMABOND (GAUZE/BANDAGES/DRESSINGS) ×1
APL SKNCLS STERI-STRIP NONHPOA (GAUZE/BANDAGES/DRESSINGS) ×1
BENZOIN TINCTURE PRP APPL 2/3 (GAUZE/BANDAGES/DRESSINGS) ×3 IMPLANT
CLIP FILSHIE TUBAL LIGA STRL (Clip) ×4 IMPLANT
CLOSURE WOUND 1/2 X4 (GAUZE/BANDAGES/DRESSINGS) ×1
CLOTH BEACON ORANGE TIMEOUT ST (SAFETY) ×3 IMPLANT
DERMABOND ADHESIVE PROPEN (GAUZE/BANDAGES/DRESSINGS) ×2
DERMABOND ADVANCED .7 DNX6 (GAUZE/BANDAGES/DRESSINGS) IMPLANT
DRSG OPSITE POSTOP 3X4 (GAUZE/BANDAGES/DRESSINGS) ×3 IMPLANT
DURAPREP 26ML APPLICATOR (WOUND CARE) ×3 IMPLANT
GLOVE BIOGEL PI IND STRL 7.0 (GLOVE) ×3 IMPLANT
GLOVE BIOGEL PI INDICATOR 7.0 (GLOVE) ×6
GLOVE ECLIPSE 7.0 STRL STRAW (GLOVE) ×3 IMPLANT
GOWN STRL REUS W/TWL LRG LVL3 (GOWN DISPOSABLE) ×6 IMPLANT
GOWN STRL REUS W/TWL XL LVL3 (GOWN DISPOSABLE) ×3 IMPLANT
NEEDLE HYPO 22GX1.5 SAFETY (NEEDLE) ×3 IMPLANT
NS IRRIG 1000ML POUR BTL (IV SOLUTION) ×3 IMPLANT
PACK ABDOMINAL MINOR (CUSTOM PROCEDURE TRAY) ×3 IMPLANT
PROTECTOR NERVE ULNAR (MISCELLANEOUS) ×3 IMPLANT
STRIP CLOSURE SKIN 1/2X4 (GAUZE/BANDAGES/DRESSINGS) ×2 IMPLANT
SUT VIC AB 0 CT1 27 (SUTURE) ×3
SUT VIC AB 0 CT1 27XBRD ANBCTR (SUTURE) ×1 IMPLANT
SUT VIC AB 4-0 PS2 27 (SUTURE) ×3 IMPLANT
SYR CONTROL 10ML LL (SYRINGE) ×3 IMPLANT
TOWEL OR 17X24 6PK STRL BLUE (TOWEL DISPOSABLE) ×6 IMPLANT
TRAY FOLEY CATH 16FR SILVER (SET/KITS/TRAYS/PACK) ×3 IMPLANT

## 2018-01-31 NOTE — Anesthesia Preprocedure Evaluation (Signed)
Anesthesia Evaluation  Patient identified by MRN, date of birth, ID band Patient awake    Reviewed: Allergy & Precautions, H&P , NPO status , Patient's Chart, lab work & pertinent test results, reviewed documented beta blocker date and time   Airway Mallampati: II  TM Distance: >3 FB Neck ROM: full    Dental no notable dental hx.    Pulmonary neg pulmonary ROS, Current Smoker,    Pulmonary exam normal breath sounds clear to auscultation       Cardiovascular hypertension, Normal cardiovascular exam Rhythm:regular Rate:Normal     Neuro/Psych negative neurological ROS  negative psych ROS   GI/Hepatic negative GI ROS, Neg liver ROS,   Endo/Other  negative endocrine ROS  Renal/GU negative Renal ROS  negative genitourinary   Musculoskeletal   Abdominal   Peds  Hematology negative hematology ROS (+)   Anesthesia Other Findings   Reproductive/Obstetrics (+) Pregnancy                             Anesthesia Physical  Anesthesia Plan  ASA: III  Anesthesia Plan: Epidural   Post-op Pain Management:    Induction:   PONV Risk Score and Plan: 2 and Ondansetron, Dexamethasone and Treatment may vary due to age or medical condition  Airway Management Planned: Natural Airway and Simple Face Mask  Additional Equipment:   Intra-op Plan:   Post-operative Plan:   Informed Consent: I have reviewed the patients History and Physical, chart, labs and discussed the procedure including the risks, benefits and alternatives for the proposed anesthesia with the patient or authorized representative who has indicated his/her understanding and acceptance.     Plan Discussed with:   Anesthesia Plan Comments:         Anesthesia Quick Evaluation

## 2018-01-31 NOTE — Anesthesia Postprocedure Evaluation (Signed)
Anesthesia Post Note  Patient: Melinda Simmons  Procedure(s) Performed: POST PARTUM TUBAL LIGATION (Bilateral )     Patient location during evaluation: PACU Anesthesia Type: General Level of consciousness: awake and alert Pain management: pain level controlled Vital Signs Assessment: post-procedure vital signs reviewed and stable Respiratory status: spontaneous breathing, nonlabored ventilation, respiratory function stable and patient connected to nasal cannula oxygen Cardiovascular status: blood pressure returned to baseline and stable Postop Assessment: no apparent nausea or vomiting Anesthetic complications: no    Last Vitals:  Vitals:   01/31/18 1730 01/31/18 1745  BP: (!) 118/93 122/89  Pulse: 100 87  Resp: 19 12  Temp:  (!) 36.2 C  SpO2: 100% 100%    Last Pain:  Vitals:   01/31/18 1745  TempSrc: Axillary  PainSc: 0-No pain   Pain Goal: Patients Stated Pain Goal: 0 (01/31/18 1041)               Kennieth RadFitzgerald, [name] E

## 2018-01-31 NOTE — Progress Notes (Signed)
42 y.o. [id number] with undesired fertility,status post vaginal delivery, desires permanent sterilization. Risks and benefits of procedure discussed with patient including permanence of method, bleeding, infection, the use of Filshie clips, injury to surrounding organs and need for additional procedures. Risk failure of 0.5-1% with increased risk of ectopic gestation if pregnancy occurs was also discussed with patient. Some patients experience menstrual difficulty after tubal ligation. Patient verbalized understanding and all questions were answered.  Currie ParisJames G. Debroah LoopArnold MD 01/31/2018 1:57 PM   Patient ID: Delfina Redwoodharlette N Gertsch, female   DOB: 09/11/1975, 42 y.o.   MRN: [id number]

## 2018-01-31 NOTE — Anesthesia Procedure Notes (Addendum)
Procedure Name: Intubation Date/Time: 01/31/2018 4:23 PM Performed by: Marcene DuosFitzgerald, Saveah Bahar, MD Pre-anesthesia Checklist: Patient identified, Emergency Drugs available, Suction available and Patient being monitored Patient Re-evaluated:Patient Re-evaluated prior to induction Oxygen Delivery Method: Circle system utilized Preoxygenation: Pre-oxygenation with 100% oxygen Induction Type: IV induction, Rapid sequence and Cricoid Pressure applied Tube type: Oral Tube size: 7.0 mm Number of attempts: 2 Airway Equipment and Method: Stylet and Oral airway Placement Confirmation: ETT inserted through vocal cords under direct vision,  positive ETCO2 and breath sounds checked- equal and bilateral Secured at: 22 cm Tube secured with: Tape Dental Injury: Teeth and Oropharynx as per pre-operative assessment  Comments: DL x1 by myself R. Sampson GoonFitzgerald, MD after inadequate view by CRNA Charmian MuffBrewer. Grade II view of cords. ATOI. +BBS, +ETCO2. Tube secured at 22cm at lip.

## 2018-01-31 NOTE — Op Note (Signed)
Melinda Simmons 01/31/2018  PREOPERATIVE DIAGNOSIS:  Undesired fertility  POSTOPERATIVE DIAGNOSIS:  Undesired fertility  PROCEDURE:  Postpartum Bilateral Tubal Sterilization using Filshie Clips   SURGEON: Surgeon(s) and Role:    * Adam PhenixArnold, James G, MD - Primary    * Inaki Vantine, Kandra NicolasJulie P, MD - OB Fellow  ANESTHESIA:  General  COMPLICATIONS:  None immediate.  ESTIMATED BLOOD LOSS:  5 mL  FLUIDS: 500 cc LR.  URINE OUTPUT:  305 cc of clear urine.  INDICATIONS: 42 y.o. yo [id number]  with undesired fertility,status post vaginal delivery, desires permanent sterilization. Risks and benefits of procedure discussed with patient including permanence of method, bleeding, infection, injury to surrounding organs and need for additional procedures. Risk failure of 0.5-1% with increased risk of ectopic gestation if pregnancy occurs was also discussed with patient.   FINDINGS:  Normal uterus, tubes, and ovaries.  TECHNIQUE:  The patient was taken to the operating room where her epidural anesthesia was dosed up to surgical level and found to be adequate.  She was then placed in the dorsal supine position and prepped and draped in sterile fashion.  After an adequate timeout was performed, attention was turned to the patient's abdomen where a small transverse skin incision was made under the umbilical fold. The incision was taken down to the layer of fascia using the scalpel, and fascia was incised, and extended bilaterally using Mayo scissors. The peritoneum was entered in a sharp fashion. Attention was then turned to the patient's uterus, and right fallopian tube was identified and followed out to the fimbriated end.  A Filshie clip was placed on the right fallopian tube about 2 cm from the cornual attachment, with care given to incorporate the underlying mesosalpinx.  A similar process was carried out on the left side allowing for bilateral tubal sterilization.  Good hemostasis was noted overall.  Local  analgesia was drizzled on both operative sites.The instruments were then removed from the patient's abdomen and the fascial incision was repaired with 0 Vicryl, and the skin was closed with a 4-0 Vicryl subcuticular stitch. The patient tolerated the procedure well.  Sponge, lap, and needle counts were correct times two.  The patient was then taken to the recovery room awake, extubated and in stable condition.   Dason Mosley, Kandra NicolasJulie P, MD OB Fellow 01/31/2018 5:13 PM

## 2018-01-31 NOTE — Progress Notes (Signed)
POSTPARTUM PROGRESS NOTE  Post Partum Day 1 Subjective:  Jahleah Maryelizabeth Rowan Mccready is a 42 y.o. [id number] 5643w3d s/p SVD.  No acute events overnight.  Pt denies problems with ambulating, voiding or po intake.  Patient has been NPO since midnight for BTL procedure. She denies nausea or vomiting.  She denies CP, HA, SOB. Pain is poorly controlled with tylenol, and pt has ibuprofen allergy.  She has had flatus. She has not had bowel movement.  Lochia Minimal.   Objective: Blood pressure 121/79, pulse 86, temperature 97.8 F (36.6 C), temperature source Oral, resp. rate 18, height 5\' 4"  (1.626 m), weight 192 lb (87.1 kg), last menstrual period 05/06/2017, SpO2 100 %, unknown if currently breastfeeding.  Physical Exam:  General: alert, cooperative and no distress Lochia:normal flow Chest: CTAB Heart: RRR no m/r/g Abdomen: +BS, soft, nontender,  Uterine Fundus: firm, tender DVT Evaluation: No calf swelling or tenderness Extremities: no evidence of LE edema  Recent Labs    01/30/18 0212 01/30/18 1154  HGB 11.6* 10.8*  HCT 34.4* 32.6*    Assessment/Plan:  ASSESSMENT: Delfina RedwoodCharlette N Muchmore is a 42 y.o. [id number] 4043w3d s/p SVD  Plan for discharge tomorrow  Bottlefeeding without issue Contraception: BTL today at 3:00pm Pain: Oxycodone 5 mg Q4H PRN Circumcision: Inpatient today   LOS: 1 day   Cyndee Brightlyonnor  M Desyre Calma, Medical Student 01/31/2018, 9:02 AM

## 2018-01-31 NOTE — Progress Notes (Signed)
CSW received consult for hx of marijuana use.  Referral was screened out due to the following: ~MOB had no documented substance use after initial prenatal visit/+UPT. ~MOB had no positive drug screens after initial prenatal visit/+UPT. ~Baby's UDS is negative.  CSW will monitor CDS results and make report to Child Protective Services if warranted.   MOB was referred for history of depression/anxiety. * Referral screened out by Clinical Social Worker because none of the following criteria appear to apply: ~ History of anxiety/depression during this pregnancy, or of post-partum depression. ~ Diagnosis of anxiety and/or depression within last 3 years; MOB will continue to meet with Integrated Behavioral Health Specialist.  OR * MOB's symptoms currently being treated with medication and/or therapy. Please contact the Clinical Social Worker if needs arise, by MOB request, or if MOB scores greater than 9/yes to question 10 on Edinburgh Postpartum Depression Screen.  Baptiste Littler Boyd-Gilyard, MSW, LCSW Clinical Social Work [phone]    

## 2018-01-31 NOTE — Transfer of Care (Signed)
Immediate Anesthesia Transfer of Care Note  Patient: Melinda Simmons  Procedure(s) Performed: POST PARTUM TUBAL LIGATION (Bilateral )  Patient Location: PACU  Anesthesia Type:General  Level of Consciousness: awake, alert , oriented and patient cooperative  Airway & Oxygen Therapy: Patient Spontanous Breathing and Patient connected to nasal cannula oxygen  Post-op Assessment: Report given to RN, Post -op Vital signs reviewed and stable and Patient moving all extremities X 4  Post vital signs: Reviewed and stable  Last Vitals:  Vitals Value Taken Time  BP    Temp    Pulse    Resp    SpO2      Last Pain:  Vitals:   01/31/18 1500  TempSrc: Oral  PainSc: 0-No pain      Patients Stated Pain Goal: 0 (01/31/18 1041)  Complications: No apparent anesthesia complications

## 2018-02-01 MED ORDER — OXYCODONE HCL 5 MG PO TABS: 5.0000 mg | ORAL_TABLET | ORAL | 0 refills | Status: DC | PRN

## 2018-02-01 MED ORDER — AMLODIPINE BESYLATE 5 MG PO TABS
5.0000 mg | ORAL_TABLET | Freq: Every day | ORAL | Status: DC
  Administered 2018-02-01: 5 mg via ORAL
  Filled 2018-02-01 (×2): qty 1

## 2018-02-01 MED ORDER — AMLODIPINE BESYLATE 5 MG PO TABS: 5.0000 mg | ORAL_TABLET | Freq: Every day | ORAL | 2 refills | Status: DC

## 2018-02-01 NOTE — Discharge Summary (Signed)
OB Discharge Summary     Patient Name: Melinda RedwoodCharlette N Botz DOB: 03/18/1976 MRN: [id number]  Date of admission: 01/30/2018 Delivering MD: Caryl AdaPHELPS, JAZMA Y   Date of discharge: 02/01/2018  Admitting diagnosis: 38 WEEKS RT SIDE PAIN HBP DIARRHEA Intrauterine pregnancy: 10325w3d     Secondary diagnosis:  Active Problems:   Gestational hypertension, third trimester   SVD (spontaneous vaginal delivery)  Additional problems:      Discharge diagnosis: Term Pregnancy Delivered                                                                                                Post partum procedures:postpartum tubal ligation  Augmentation: AROM and Pitocin  Complications: continued GHTN needing meds  Hospital course:  Induction of Labor With Vaginal Delivery   42 y.o. yo [id number] at 9125w3d was admitted to the hospital 01/30/2018 for induction of labor.  Indication for induction: Gestational hypertension.  Patient had an uncomplicated labor course as follows: Membrane Rupture Time/Date: 11:50 AM ,01/30/2018   Intrapartum Procedures: Episiotomy: None [1]                                         Lacerations:  None [1]  Patient had delivery of a Viable infant.  Information for the patient's newborn:  Orland MustardStockton, Boy Kammi [[id number]][[id number]]  Delivery Method: Vaginal, Spontaneous(Filed from Delivery Summary)   01/30/2018  Details of delivery can be found in separate delivery note.  Patient had a routine postpartum course. Patient is discharged home 02/01/18.  Physical exam  Vitals:   01/31/18 2200 02/01/18 0230 02/01/18 0315 02/01/18 0615  BP: (!) 152/96 (!) 162/95 (!) 151/86 (!) 147/94  Pulse: 84 72 77 73  Resp: 18 16    Temp: 98.2 F (36.8 C) 98.5 F (36.9 C)  98.1 F (36.7 C)  TempSrc: Oral Oral  Oral  SpO2: 100% 100%  99%  Weight:      Height:       General: alert, cooperative and no distress Lochia: appropriate Uterine Fundus: firm Incision: Healing well with no significant drainage DVT  Evaluation: No evidence of DVT seen on physical exam. Negative Homan's sign. No cords or calf tenderness. Labs: Lab Results  Component Value Date   WBC 12.1 (H) 01/31/2018   HGB 10.0 (L) 01/31/2018   HCT 30.3 (L) 01/31/2018   MCV 89.1 01/31/2018   PLT 175 01/31/2018   CMP Latest Ref Rng & Units 01/30/2018  Glucose 65 - 99 mg/dL 75  BUN 6 - 20 mg/dL 8  Creatinine 1.300.44 - 8.651.00 mg/dL 7.840.66  Sodium 696135 - 295145 mmol/L 135  Potassium 3.5 - 5.1 mmol/L 3.9  Chloride 101 - 111 mmol/L 110  CO2 22 - 32 mmol/L 16(L)  Calcium 8.9 - 10.3 mg/dL 9.1  Total Protein 6.5 - 8.1 g/dL 6.7  Total Bilirubin 0.3 - 1.2 mg/dL 0.5  Alkaline Phos 38 - 126 U/L 216(H)  AST 15 - 41 U/L 20  ALT 14 - 54 U/L 10(L)  Discharge instruction: per After Visit Summary and "Baby and Me Booklet".  After visit meds:  Allergies as of 02/01/2018      Reactions   Motrin [ibuprofen] Other (See Comments)   Causes Bleeding.      Medication List    STOP taking these medications   ferrous gluconate 324 MG tablet Commonly known as:  FERGON   flintstones complete 60 MG chewable tablet     TAKE these medications   amLODipine 5 MG tablet Commonly known as:  NORVASC Take 1 tablet (5 mg total) by mouth daily.   oxyCODONE 5 MG immediate release tablet Commonly known as:  Oxy IR/ROXICODONE Take 1 tablet (5 mg total) by mouth every 4 (four) hours as needed for moderate pain.       Diet: routine diet  Activity: Advance as tolerated. Pelvic rest for 6 weeks.   Outpatient follow up:1 week for BP check Follow up Appt:No future appointments. Follow up Visit:No follow-ups on file.  Postpartum contraception: Tubal Ligation  Newborn Data: Live born female  Birth Weight: 7 lb 6.3 oz (3355 g) APGAR: 7, 9  Newborn Delivery   Birth date/time:  01/30/2018 17:32:00 Delivery type:  Vaginal, Spontaneous     Baby Feeding: Bottle Disposition:home with mother   02/01/2018 Jacklyn Shell, CNM

## 2018-02-01 NOTE — Discharge Instructions (Signed)
Postpartum Care After Vaginal Delivery °The period of time right after you deliver your newborn is called the postpartum period. °What kind of medical care will I receive? °· You may continue to receive fluids and medicines through an IV tube inserted into one of your veins. °· If an incision was made near your vagina (episiotomy) or if you had some vaginal tearing during delivery, cold compresses may be placed on your episiotomy or your tear. This helps to reduce pain and swelling. °· You may be given a squirt bottle to use when you go to the bathroom. You may use this until you are comfortable wiping as usual. To use the squirt bottle, follow these steps: °? Before you urinate, fill the squirt bottle with warm water. Do not use hot water. °? After you urinate, while you are sitting on the toilet, use the squirt bottle to rinse the area around your urethra and vaginal opening. This rinses away any urine and blood. °? You may do this instead of wiping. As you start healing, you may use the squirt bottle before wiping yourself. Make sure to wipe gently. °? Fill the squirt bottle with clean water every time you use the bathroom. °· You will be given sanitary pads to wear. °How can I expect to feel? °· You may not feel the need to urinate for several hours after delivery. °· You will have some soreness and pain in your abdomen and vagina. °· If you are breastfeeding, you may have uterine contractions every time you breastfeed for up to several weeks postpartum. Uterine contractions help your uterus return to its normal size. °· It is normal to have vaginal bleeding (lochia) after delivery. The amount and appearance of lochia is often similar to a menstrual period in the first week after delivery. It will gradually decrease over the next few weeks to a dry, yellow-brown discharge. For most women, lochia stops completely by 6-8 weeks after delivery. Vaginal bleeding can vary from woman to woman. °· Within the first few  days after delivery, you may have breast engorgement. This is when your breasts feel heavy, full, and uncomfortable. Your breasts may also throb and feel hard, tightly stretched, warm, and tender. After this occurs, you may have milk leaking from your breasts. Your health care provider can help you relieve discomfort due to breast engorgement. Breast engorgement should go away within a few days. °· You may feel more sad or worried than normal due to hormonal changes after delivery. These feelings should not last more than a few days. If these feelings do not go away after several days, speak with your health care provider. °How should I care for myself? °· Tell your health care provider if you have pain or discomfort. °· Drink enough water to keep your urine clear or pale yellow. °· Wash your hands thoroughly with soap and water for at least 20 seconds after changing your sanitary pads, after using the toilet, and before holding or feeding your baby. °· If you are not breastfeeding, avoid touching your breasts a lot. Doing this can make your breasts produce more milk. °· If you become weak or lightheaded, or you feel like you might faint, ask for help before: °? Getting out of bed. °? Showering. °· Change your sanitary pads frequently. Watch for any changes in your flow, such as a sudden increase in volume, a change in color, the passing of large blood clots. If you pass a blood clot from your vagina, save it   to show to your health care provider. Do not flush blood clots down the toilet without having your health care provider look at them.  Make sure that all your vaccinations are up to date. This can help protect you and your baby from getting certain diseases. You may need to have immunizations done before you leave the hospital.  If desired, talk with your health care provider about methods of family planning or birth control (contraception). How can I start bonding with my baby? Spending as much time as  possible with your baby is very important. During this time, you and your baby can get to know each other and develop a bond. Having your baby stay with you in your room (rooming in) can give you time to get to know your baby. Rooming in can also help you become comfortable caring for your baby. Breastfeeding can also help you bond with your baby. How can I plan for returning home with my baby?  Make sure that you have a car seat installed in your vehicle. ? Your car seat should be checked by a certified car seat installer to make sure that it is installed safely. ? Make sure that your baby fits into the car seat safely.  Ask your health care provider any questions you have about caring for yourself or your baby. Make sure that you are able to contact your health care provider with any questions after leaving the hospital. This information is not intended to replace advice given to you by your health care provider. Make sure you discuss any questions you have with your health care provider. Document Released: 06/11/2007 Document Revised: 01/17/2016 Document Reviewed: 07/19/2015 Elsevier Interactive Patient Education  2018 Elsevier Inc.  Postpartum Tubal Ligation, Care After Refer to this sheet in the next few weeks. These instructions provide you with information about caring for yourself after your procedure. Your health care provider may also give you more specific instructions. Your treatment has been planned according to current medical practices, but problems sometimes occur. Call your health care provider if you have any problems or questions after your procedure. What can I expect after the procedure? After the procedure, it is common to have:  A sore throat.  Bruising or pain in your back.  Nausea or vomiting.  Dizziness.  Mild abdominal discomfort or pain, such as cramping, gas pain, or feeling bloated.  Soreness where the incision was made.  Tiredness.  Pain in your  shoulders.  Follow these instructions at home: Medicines  Take over-the-counter and prescription medicines only as told by your health care provider.  Do not take aspirin because it can cause bleeding.  Do not drive or operate heavy machinery while taking prescription pain medicine. Activity  Rest for the rest of the day.  Gradually return to your normal activities over the next few days.  Do not have sex, douche, or put a tampon or anything else in your vagina for 6 weeks or as long as told by your health care provider.  Do not lift anything that is heavier than your baby for 2 weeks or as long as told by your health care provider. Incision care  Follow instructions from your health care provider about how to take care of your incision. Make sure you: ? Wash your hands with soap and water before you change your bandage (dressing). If soap and water are not available, use hand sanitizer. ? Change your dressing as told by your health care provider. ? Leave  stitches (sutures) in place. They may need to stay in place for 2 weeks or longer. °· Check your incision area every day for signs of infection. Check for: °? More redness, swelling, or pain. °? More fluid or blood. °? Warmth. °? Pus or a bad smell. °Other Instructions °· Do not take baths, swim, or use a hot tub until your health care provider approves. You may take showers. °· Keep all follow-up visits as told by your health care provider. This is important. °Contact a health care provider if: °· You have more redness, swelling, or pain around your incision. °· Your incision feels warm to the touch. °· You have pus or a bad smell coming from your incision. °· The edges of your incision break open after the sutures have been removed. °· Your pain does not improve after 2-3 days. °· You have a rash. °· You repeatedly become dizzy or lightheaded. °· Your pain medicine is not helping. °· You are constipated. °Get help right away if: °· You  have a fever. °· You faint. °· You have pain in your abdomen that gets worse. °· You have fluid or blood coming from your sutures. °· You have shortness of breath or difficulty breathing. °· You have chest pain or leg pain. °· You have ongoing nausea or diarrhea. °This information is not intended to replace advice given to you by your health care provider. Make sure you discuss any questions you have with your health care provider. °Document Released: 02/13/2012 Document Revised: 01/17/2016 Document Reviewed: 07/25/2015 °Elsevier Interactive Patient Education © 2018 Elsevier Inc. ° ° °

## 2018-02-06 ENCOUNTER — Telehealth: Payer: Self-pay | Admitting: General Practice

## 2018-02-06 NOTE — Telephone Encounter (Signed)
[facility] called into front office stating she was performing a home visit on the patient for BP check. Her BP was 116/76 and patient reports taking norvasc daily. Recommended patient contact us to schedule pp visit.

## 2018-02-07 ENCOUNTER — Encounter: Payer: Self-pay | Admitting: Student

## 2018-02-12 ENCOUNTER — Ambulatory Visit (INDEPENDENT_AMBULATORY_CARE_PROVIDER_SITE_OTHER): Payer: BLUE CROSS/BLUE SHIELD | Admitting: General Practice

## 2018-02-12 VITALS — BP 138/87 | HR 72 | Ht 64.0 in | Wt 169.0 lb

## 2018-02-12 DIAGNOSIS — Z0130 Encounter for examination of blood pressure without abnormal findings: Secondary | ICD-10-CM

## 2018-02-12 DIAGNOSIS — Z5189 Encounter for other specified aftercare: Secondary | ICD-10-CM

## 2018-02-12 NOTE — Progress Notes (Signed)
Patient presents to office today for wound check & BP check following vaginal delivery with BTL on 6/5. Patient reports occasional headaches but they are relieved with medication. Reports taking BP medication daily. Patient denies dizziness or blurry vision. No edema noted. Incision is clean, dry & intact. Patient will follow up at scheduled pp visit. Patient had no questions.

## 2018-02-13 NOTE — Progress Notes (Signed)
I was present at the time of this nurse visit.  Agree with nursing documentation.   Teion Ballin P. Dallyn Bergland, MD OB Fellow   

## 2018-02-14 ENCOUNTER — Encounter: Payer: Self-pay | Admitting: Advanced Practice Midwife

## 2018-03-13 ENCOUNTER — Encounter: Payer: Self-pay | Admitting: General Practice

## 2018-03-13 ENCOUNTER — Encounter: Payer: Self-pay | Admitting: Family Medicine

## 2018-03-13 ENCOUNTER — Ambulatory Visit (INDEPENDENT_AMBULATORY_CARE_PROVIDER_SITE_OTHER): Payer: BLUE CROSS/BLUE SHIELD | Admitting: Family Medicine

## 2018-03-13 DIAGNOSIS — Z1389 Encounter for screening for other disorder: Secondary | ICD-10-CM | POA: Diagnosis not present

## 2018-03-13 DIAGNOSIS — I10 Essential (primary) hypertension: Secondary | ICD-10-CM

## 2018-03-13 NOTE — Progress Notes (Signed)
Subjective:     Melinda Simmons is a 42 y.o. female who presents for a postpartum visit. She is 6 weeks postpartum following a spontaneous vaginal delivery. I have fully reviewed the prenatal and intrapartum course. The delivery was at 38 gestational weeks. Outcome: spontaneous vaginal delivery. Anesthesia: epidural. Postpartum course has been uncomplicated. Baby's course has been uncompicate. Baby is feeding by bottle - Enfamil AR. Bleeding no bleeding. Bowel function is normal. Bladder function is normal. Patient is not sexually active. Contraception method is BTL.  Postpartum depression screening: negative.  The following portions of the patient's history were reviewed and updated as appropriate: allergies, current medications, past family history, past medical history, past social history, past surgical history and problem list.  Review of Systems A comprehensive review of systems was negative except for: small piece of suture stcking out of her BTL incision   Objective:    BP (!) 149/89   Pulse 61   Ht 5' 4.5" (1.638 m)   Wt 173 lb 14.4 oz (78.9 kg)   LMP 05/06/2017 (Approximate)   Breastfeeding? No   BMI 29.39 kg/m    General:  Well-appearing female in NAD   HENT:  [place]/AT, normal oral mucosa  Eyes: Normal conjunctivae, no scleral icterus  Lungs:  Normal respiratory effort  Heart: Normal rate  Abdomen: Soft, ND, ND, +BS  Skin : Warm, dry. BTL incision well-healed, clean, dry, w/o erythema or drainage, small piece of Vycril suture on left corner of incision visible - this was pulled up and cut.  MSK: No LE edema  Psych: Normal mood, appropriate affect  Neuro:  Alert and oriented x3         Assessment:   1. Normal postpartum exam. Pap smear not done at today's visit.   2. GHTN  Plan:    1. Contraception: s/p BTL 2. GHTN: Could increase amlodipine, but she states she will work on diet and exercise. Discussed low-sodium diet and going on daily 25-20 minutes walk a day.  She will follow up with her PCP in 4 weeks 3. Advised she will need repeat pap smear in December given +HPV with normal pathology last pap. 3. Follow up in: 6 months for pap smear or as needed.    Raynelle FanningJulie P. Sheralee Qazi, MD OB Fellow

## 2018-03-13 NOTE — Patient Instructions (Signed)

## 2018-03-26 ENCOUNTER — Emergency Department (HOSPITAL_COMMUNITY)
Admission: EM | Admit: 2018-03-26 | Discharge: 2018-03-26 | Disposition: A | Discharge status: Home / Self Care | Payer: BLUE CROSS/BLUE SHIELD | Attending: Emergency Medicine | Admitting: Emergency Medicine

## 2018-03-26 ENCOUNTER — Other Ambulatory Visit: Payer: Self-pay

## 2018-03-26 ENCOUNTER — Encounter (HOSPITAL_COMMUNITY): Payer: Self-pay | Admitting: Emergency Medicine

## 2018-03-26 DIAGNOSIS — Y9389 Activity, other specified: Secondary | ICD-10-CM | POA: Insufficient documentation

## 2018-03-26 DIAGNOSIS — Y929 Unspecified place or not applicable: Secondary | ICD-10-CM | POA: Insufficient documentation

## 2018-03-26 DIAGNOSIS — X12XXXA Contact with other hot fluids, initial encounter: Secondary | ICD-10-CM | POA: Diagnosis not present

## 2018-03-26 DIAGNOSIS — T2102XA Burn of unspecified degree of abdominal wall, initial encounter: Secondary | ICD-10-CM | POA: Diagnosis present

## 2018-03-26 DIAGNOSIS — T2122XA Burn of second degree of abdominal wall, initial encounter: Secondary | ICD-10-CM

## 2018-03-26 DIAGNOSIS — Y999 Unspecified external cause status: Secondary | ICD-10-CM | POA: Diagnosis not present

## 2018-03-26 DIAGNOSIS — T24211A Burn of second degree of right thigh, initial encounter: Secondary | ICD-10-CM | POA: Diagnosis not present

## 2018-03-26 DIAGNOSIS — T24201A Burn of second degree of unspecified site of right lower limb, except ankle and foot, initial encounter: Secondary | ICD-10-CM

## 2018-03-26 MED ORDER — SILVER SULFADIAZINE 1 % EX CREA
TOPICAL_CREAM | Freq: Once | CUTANEOUS | Status: AC
  Administered 2018-03-26: via TOPICAL
  Filled 2018-03-26: qty 85

## 2018-03-26 MED ORDER — SILVER SULFADIAZINE 1 % EX CREA: 1.0000 "application " | TOPICAL_CREAM | Freq: Every day | CUTANEOUS | 0 refills | Status: DC

## 2018-03-26 MED ORDER — OXYCODONE-ACETAMINOPHEN 5-325 MG PO TABS: 1.0000 | ORAL_TABLET | Freq: Three times a day (TID) | ORAL | 0 refills | Status: DC | PRN

## 2018-03-26 MED ORDER — OXYCODONE-ACETAMINOPHEN 5-325 MG PO TABS
1.0000 | ORAL_TABLET | ORAL | Status: DC | PRN
  Administered 2018-03-26: 1 via ORAL
  Filled 2018-03-26: qty 1

## 2018-03-26 NOTE — Discharge Instructions (Signed)
Return to ED for worsening symptoms, drainage from the area, fevers, red streaks on your leg or abdomen, lightheadedness.
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
## 2018-04-05 ENCOUNTER — Other Ambulatory Visit: Payer: Self-pay | Admitting: *Deleted

## 2018-04-05 MED ORDER — AMLODIPINE BESYLATE 5 MG PO TABS: 5.0000 mg | ORAL_TABLET | Freq: Every day | ORAL | 3 refills | Status: AC

## 2018-04-05 NOTE — Telephone Encounter (Signed)
Received fax requesting 90 day supply of amlodipine

## 2018-05-27 ENCOUNTER — Encounter (HOSPITAL_COMMUNITY): Payer: Self-pay

## 2018-12-31 IMAGING — US US MFM OB FOLLOW-UP
1 series · 14 of 27 positions shown · non-contrast
Comparison: none

[Series 1: us mfm ob follow-up · 27 acquisitions, 14 frames shown]
[im 1/27]
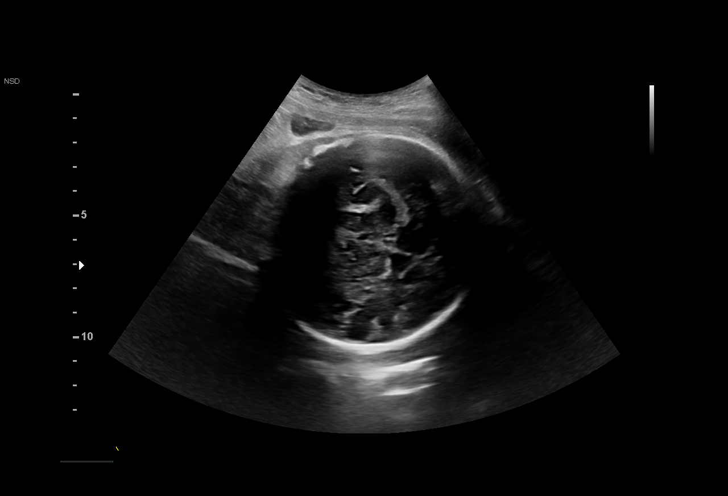
[im 3/27]
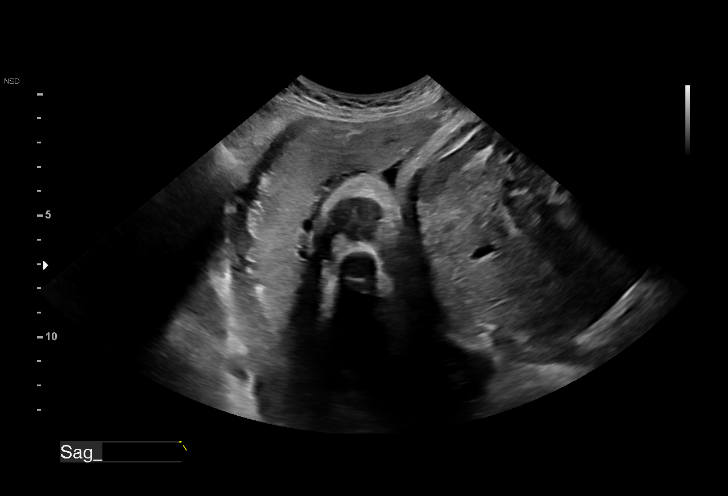
[im 5/27]
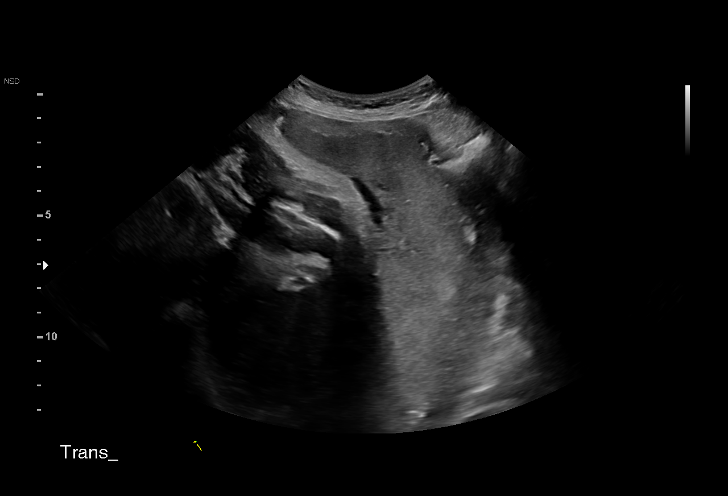
[im 7/27]
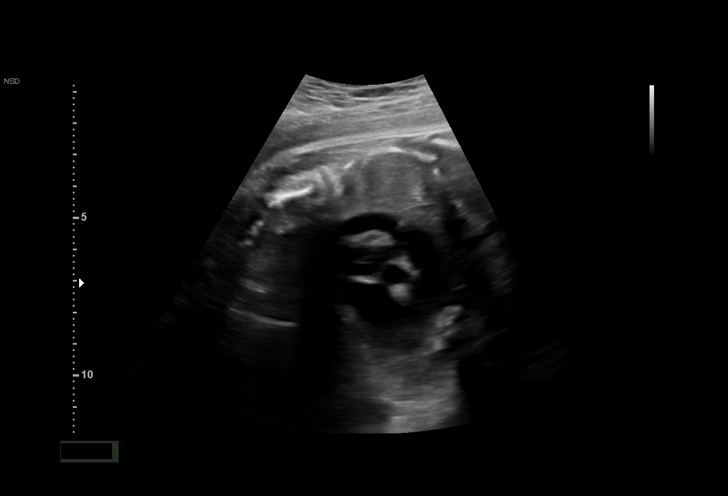
[im 9/27]
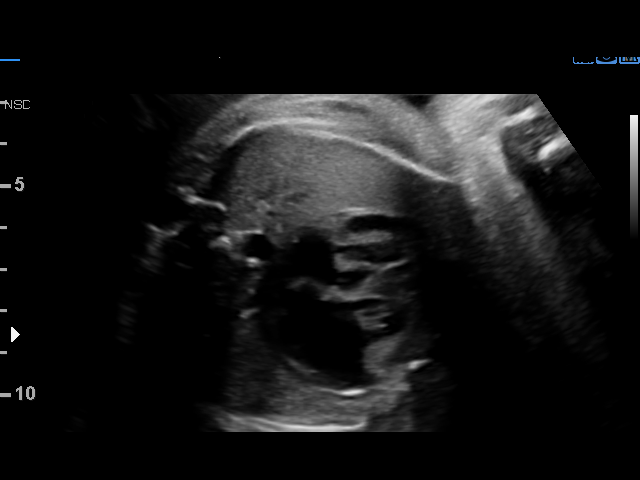
[im 11/27]
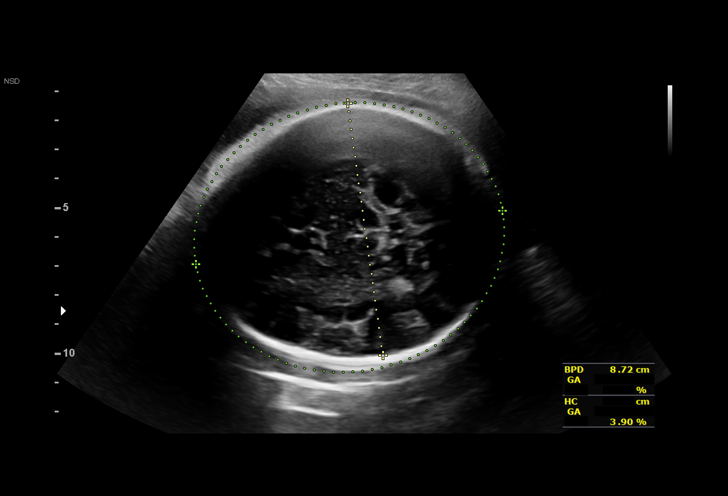
[im 13/27]
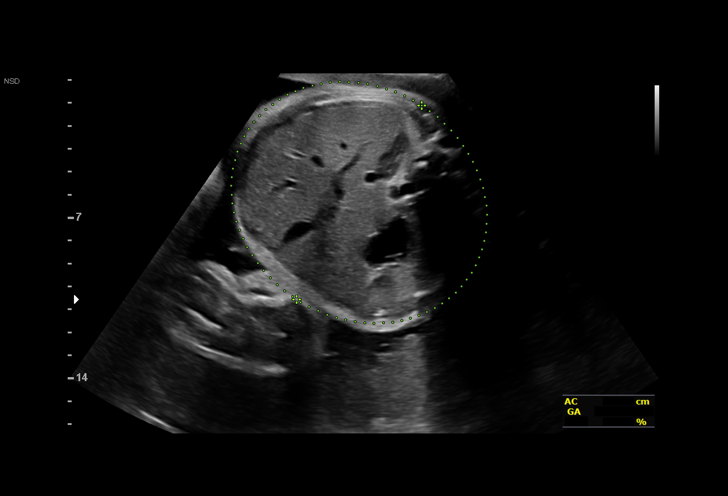
[im 15/27]
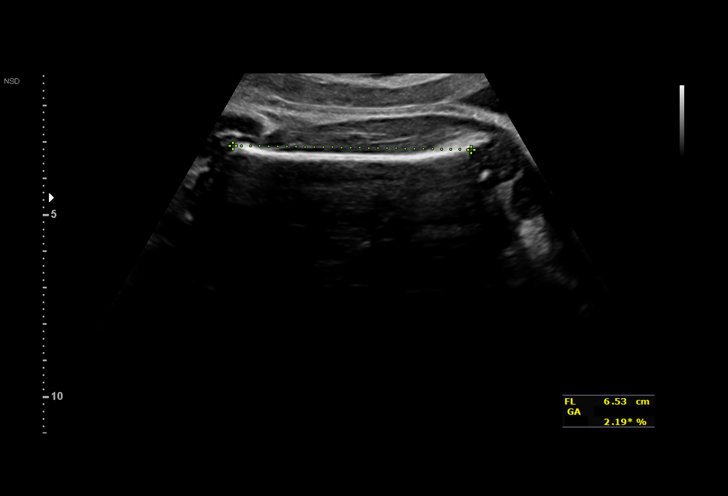
[im 17/27]
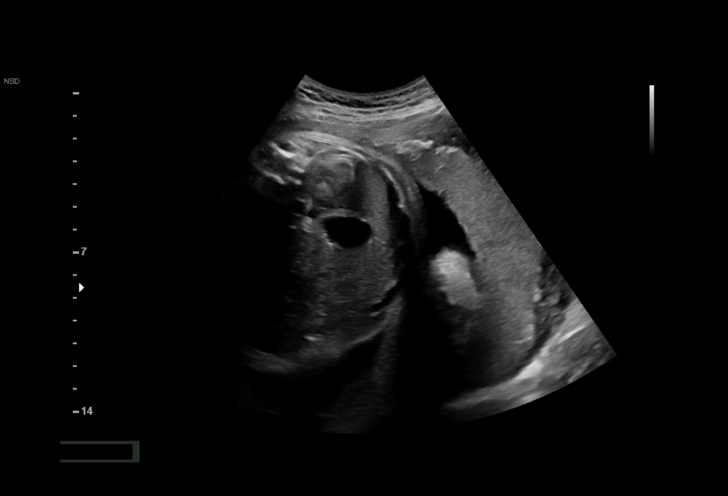
[im 19/27]
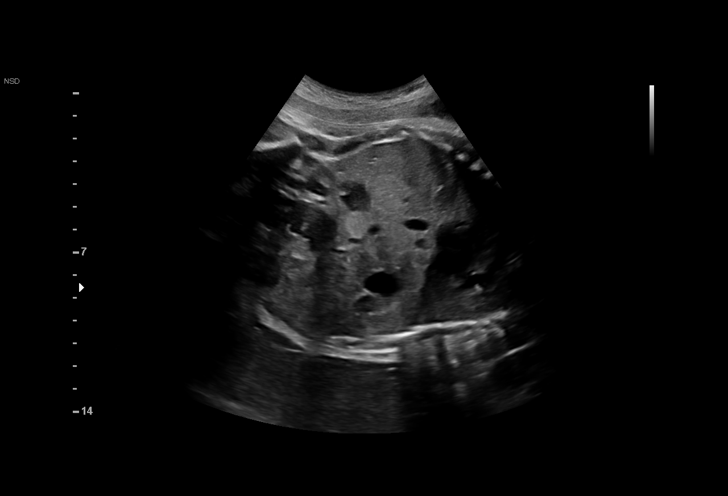
[im 21/27]
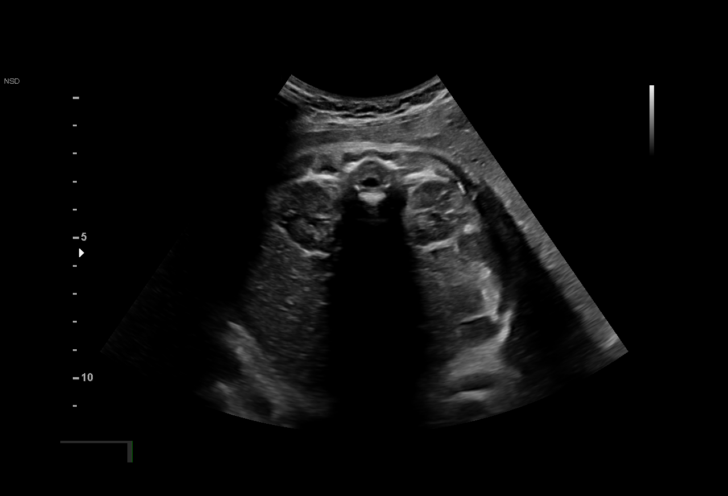
[im 23/27]
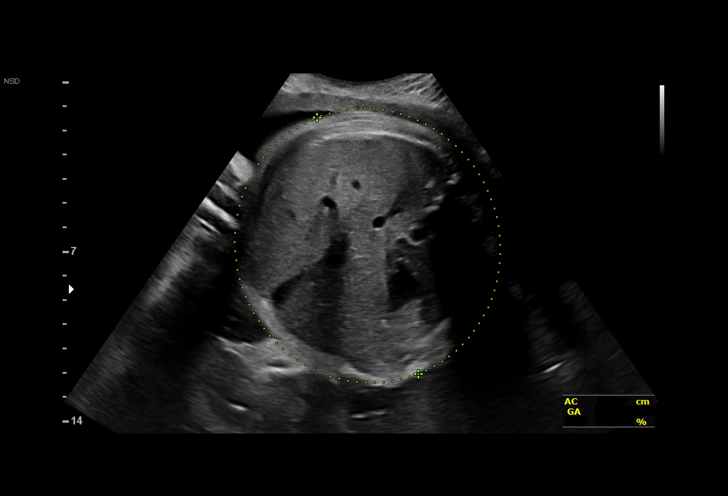
[im 25/27]
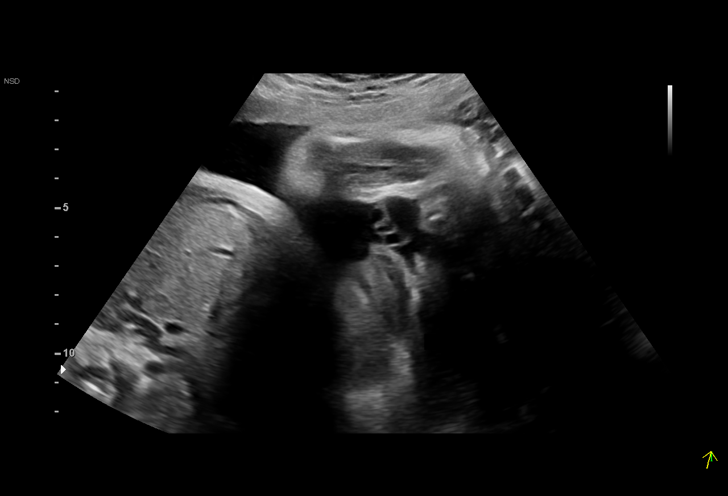
[im 27/27]
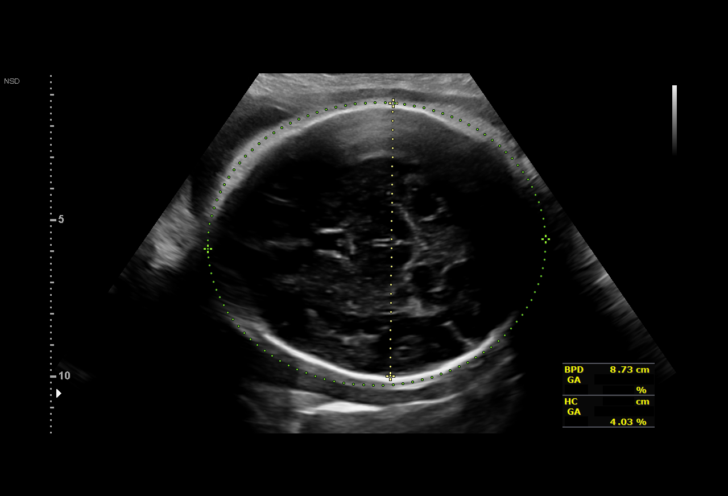

[14 of 27 positions shown; findings below may reference images not displayed]

pm)

OB/Gyn Clinic
[REDACTED]

1  DIETER TOOR         [id number]      [id number]     [id number]
Indications

36 weeks gestation of pregnancy
Smoking complicating pregnancy, third
trimester
Marijuana Abuse (Daily until positive test)
Advanced maternal age multigravida 35+,
third trimester
Encounter for other antenatal screening
follow-up
Antenatal follow-up for nonvisualized fetal
anatomy
OB History

Blood Type:            Height:  5'4"   Weight (lb):  153       BMI:
Gravidity:    6         Term:   3         SAB:   2
Living:       3
Fetal Evaluation

Num Of Fetuses:     1
Fetal Heart         145
Rate(bpm):
Cardiac Activity:   Observed
Presentation:       Cephalic
Placenta:           Posterior, above cervical os
P. Cord Insertion:  Previously Visualized

Amniotic Fluid
AFI FV:      Subjectively within normal limits

AFI Sum(cm)     %Tile       Largest Pocket(cm)
16.05           60

RUQ(cm)       RLQ(cm)       LUQ(cm)        LLQ(cm)
5.99
Biometry

BPD:      87.3  mm     G. Age:  35w 2d         28  %    CI:        77.99   %    70 - 86
FL/HC:      20.9   %    20.1 -
HC:      312.8  mm     G. Age:  35w 0d          5  %    HC/AC:      0.91        0.93 -
AC:      343.1  mm     G. Age:  38w 1d         94  %    FL/BPD:     74.8   %    71 - 87
FL:       65.3  mm     G. Age:  33w 5d        < 3  %    FL/AC:      19.0   %    20 - 24

Est. FW:    6706  gm      6 lb 8 oz     67  %
Gestational Age

LMP:           36w 3d        Date:  05/06/17                 EDD:   02/10/18
U/S Today:     35w 4d                                        EDD:   02/16/18
Best:          36w 3d     Det. By:  LMP  (05/06/17)          EDD:   02/10/18
Anatomy

Cranium:               Appears normal         Aortic Arch:            Previously seen
Cavum:                 Previously seen        Ductal Arch:            Previously seen
Ventricles:            Previously seen        Diaphragm:              Appears normal
Choroid Plexus:        Previously seen        Stomach:                Appears normal, left
sided
Cerebellum:            Previously seen        Abdomen:                Appears normal
Posterior Fossa:       Previously seen        Abdominal Wall:         Previously seen
Nuchal Fold:           Not applicable (>20    Cord Vessels:           Previously seen
wks GA)
Face:                  Orbits and profile     Kidneys:                Appear normal
previously seen
Lips:                  Previously seen        Bladder:                Appears normal
Thoracic:              Appears normal         Spine:                  Previously seen
Heart:                 Previously seen        Upper Extremities:      Previously seen
RVOT:                  Appears normal         Lower Extremities:      Previously seen
LVOT:                  Previously seen

Other:  Male gender previously seen. Heels and 5th digit previously
visualized. Open hands previously visualized. Nasal bone previously
visualized. Technically difficult due to fetal position.
Cervix Uterus Adnexa

Cervix
Not visualized (advanced GA >80wks)

Left Ovary
Previously seen.
Right Ovary
Previously seen

Adnexa:       No abnormality visualized.
Impression

Singleton intrauterine pregnancy at 36+3 weeks with AMA,
fetal PACs, marijuana use and tobacco use
Review of the anatomy shows no sonographic markers for
aneuploidy or structural anomalies
All relevant fetal anatomy has been visualized
No fetal PACs are noted
Amniotic fluid volume is normal
Estimated fetal weight shows growth in the 67th percentile
Recommendations

Follow-up ultrasounds as clinically indicated.

## 2019-10-05 ENCOUNTER — Ambulatory Visit (HOSPITAL_COMMUNITY)
Admission: EM | Admit: 2019-10-05 | Discharge: 2019-10-05 | Disposition: A | Discharge status: Home / Self Care | Payer: BC Managed Care – PPO | Attending: Emergency Medicine | Admitting: Emergency Medicine

## 2019-10-05 ENCOUNTER — Other Ambulatory Visit: Payer: Self-pay

## 2019-10-05 ENCOUNTER — Encounter (HOSPITAL_COMMUNITY): Payer: Self-pay | Admitting: Emergency Medicine

## 2019-10-05 DIAGNOSIS — M5441 Lumbago with sciatica, right side: Secondary | ICD-10-CM | POA: Diagnosis present

## 2019-10-05 LAB — POCT URINALYSIS DIP (DEVICE)
Bilirubin Urine: NEGATIVE
Glucose, UA: NEGATIVE mg/dL
Hgb urine dipstick: NEGATIVE
Ketones, ur: NEGATIVE mg/dL
Nitrite: NEGATIVE
Protein, ur: NEGATIVE mg/dL
Specific Gravity, Urine: 1.02 (ref 1.005–1.030)
Urobilinogen, UA: 1 mg/dL (ref 0.0–1.0)
pH: 7 (ref 5.0–8.0)

## 2019-10-05 MED ORDER — METHYLPREDNISOLONE 4 MG PO TBPK: ORAL_TABLET | ORAL | 0 refills | Status: DC

## 2019-10-05 MED ORDER — CYCLOBENZAPRINE HCL 5 MG PO TABS
5.0000 mg | ORAL_TABLET | Freq: Every day | ORAL | 0 refills | Status: AC
Start: 2019-10-05 — End: ?

## 2019-10-05 NOTE — Discharge Instructions (Signed)
Your urine isn't obviously infected tonight, I will send it for culture to confirm.  Your exam is consistent with strain and sciatica, see information provided about this.  Light and regular activity as tolerated.  See exercises provided.  Heat application while active can help with muscle spasms.  Sleep with pillow under your knees.   Complete course of steroid pain as provided.  Flexeril at night as a muscle relaxer. May cause drowsiness. Please do not take if driving or drinking alcohol.  Tylenol for breakthrough pain.  If symptoms worsen or do not improve in the next week to return to be seen or to follow up with your PCP.

## 2019-10-05 NOTE — ED Triage Notes (Signed)
Pt c/o lower back pain since yesterday. Denies known injury. States she does a lot of lifting at work.

## 2019-10-06 LAB — URINE CULTURE: Culture: NO GROWTH

## 2019-10-06 NOTE — ED Provider Notes (Signed)
[facility]    CSN: [id number] Arrival date & time: 10/05/19  1555      History   Chief Complaint Chief Complaint  Patient presents with  . Back Pain    HPI Melinda Simmons is a 44 y.o. female.   Melinda Simmons presents with complaints of low back , worse on the right and into right buttocks. Gradual onset, worse last night. She lifts boxes regularly at work but there was no specific injury she is aware of pain. Pain 8/10. Worse with movements such as bending. Pain 8/10 in severity. No pain to thighs, no saddle symptoms, no numbness tingling or weakness to lower extremities. Has had some stress incontinence with coughing/sneezing but no specific loss of control of bladder or bowel.  (has had 4 children). Took tylenol last night which helped, no medications today for pain. She is ambulatory.    ROS per HPI, negative if not otherwise mentioned.      Past Medical History:  Diagnosis Date  . Abnormal Pap smear    cryo, rpt wnl  . Chlamydia   . Gonorrhea   . Urinary tract infection     Patient Active Problem List   Diagnosis Date Noted  . UTI (urinary tract infection) during pregnancy, third trimester 12/05/2017  . Antepartum multigravida of advanced maternal age 61/14/2018    Past Surgical History:  Procedure Laterality Date  . THERAPEUTIC ABORTION    . TUBAL LIGATION Bilateral 01/31/2018   Procedure: POST PARTUM TUBAL LIGATION;  Surgeon: Adam Phenix, MD;  Location: [facility];  Service: Gynecology;  Laterality: Bilateral;    OB History    Gravida  6   Para  4   Term  4   Preterm  0   AB  2   Living  4     SAB  0   TAB  2   Ectopic  0   Multiple  0   Live Births  4            Home Medications    Prior to Admission medications   Medication Sig Start Date End Date Taking? Authorizing Provider  amLODipine (NORVASC) 5 MG tablet Take 1 tablet (5 mg total) by mouth daily. 02/01/18   Cresenzo-Dishmon, Scarlette Calico, CNM    amLODipine (NORVASC) 5 MG tablet Take 1 tablet (5 mg total) by mouth daily. 04/05/18   Reva Bores, MD  cyclobenzaprine (FLEXERIL) 5 MG tablet Take 1 tablet (5 mg total) by mouth at bedtime. 10/05/19   Georgetta Haber, NP  methylPREDNISolone (MEDROL DOSEPAK) 4 MG TBPK tablet Per box instructions 10/05/19   Georgetta Haber, NP    Family History Family History  Problem Relation Age of Onset  . Hypertension Mother   . Stomach cancer Father   . Asthma Father   . Bronchitis Father   . Anesthesia problems Neg Hx     Social History Social History   Tobacco Use  . Smoking status: Current Every Day Smoker    Packs/day: 0.25    Years: 10.00    Pack years: 2.50    Types: Cigarettes  . Smokeless tobacco: Never Used  . Tobacco comment: cutting back  Substance Use Topics  . Alcohol use: No  . Drug use: Yes    Types: Marijuana    Comment: daily- until had Pos test     Allergies   Motrin [ibuprofen]   Review of Systems Review of Systems   Physical Exam  Triage Vital Signs ED Triage Vitals  Enc Vitals Group     BP 10/05/19 1713 (!) 156/96     Pulse Rate 10/05/19 1713 80     Resp 10/05/19 1713 18     Temp 10/05/19 1713 98.8 F (37.1 C)     Temp Source 10/05/19 1713 Oral     SpO2 10/05/19 1713 100 %     Weight --      Height --      Head Circumference --      Peak Flow --      Pain Score 10/05/19 1723 8     Pain Loc --      Pain Edu? --      Excl. in [place]? --    No data found.  Updated Vital Signs BP (!) 156/96 (BP Location: Left Arm)   Pulse 80   Temp 98.8 F (37.1 C) (Oral)   Resp 18   SpO2 100%   Breastfeeding No   Visual Acuity Right Eye Distance:   Left Eye Distance:   Bilateral Distance:    Right Eye Near:   Left Eye Near:    Bilateral Near:     Physical Exam Constitutional:      General: She is not in acute distress.    Appearance: She is well-developed.  Cardiovascular:     Rate and Rhythm: Normal rate.  Pulmonary:     Effort: Pulmonary  effort is normal.  Musculoskeletal:     Lumbar back: Tenderness and bony tenderness present. Normal range of motion. Positive right straight leg raise test. Negative left straight leg raise test.     Comments: no step off or deformity to spinous processes; pain with transition from sit to lay and lay to sit; strength equal bilaterally; gross sensation intact to lower extremities; ambulatory without difficulty   Skin:    General: Skin is warm and dry.  Neurological:     Mental Status: She is alert and oriented to person, place, and time.      UC Treatments / Results  Labs (all labs ordered are listed, but only abnormal results are displayed) Labs Reviewed  POCT URINALYSIS DIP (DEVICE) - Abnormal; Notable for the following components:      Result Value   Leukocytes,Ua TRACE (*)    All other components within normal limits  URINE CULTURE    EKG   Radiology No results found.  Procedures Procedures (including critical care time)  Medications Ordered in UC Medications - No data to display  Initial Impression / Assessment and Plan / UC Course  I have reviewed the triage vital signs and the nursing notes.  Pertinent labs & imaging results that were available during my care of the patient were reviewed by me and considered in my medical decision making (see chart for details).     No red flag neurological findings, consistent with sciatica at this time. Urine obtained as has noted some stress incontinence, culture pending due to trace leuks. Pain management discussed and encouraged limited lifting for the next few days. Return precautions provided. Patient verbalized understanding and agreeable to plan.  Ambulatory out of clinic without difficulty.    Final Clinical Impressions(s) / UC Diagnoses   Final diagnoses:  Acute right-sided low back pain with right-sided sciatica     Discharge Instructions     Your urine isn't obviously infected tonight, I will send it for culture  to confirm.  Your exam is consistent with strain and sciatica, see  information provided about this.  Light and regular activity as tolerated.  See exercises provided.  Heat application while active can help with muscle spasms.  Sleep with pillow under your knees.   Complete course of steroid pain as provided.  Flexeril at night as a muscle relaxer. May cause drowsiness. Please do not take if driving or drinking alcohol.  Tylenol for breakthrough pain.  If symptoms worsen or do not improve in the next week to return to be seen or to follow up with your PCP.     ED Prescriptions    Medication Sig Dispense Auth. Provider   methylPREDNISolone (MEDROL DOSEPAK) 4 MG TBPK tablet Per box instructions 21 tablet Leona Pressly B, NP   cyclobenzaprine (FLEXERIL) 5 MG tablet Take 1 tablet (5 mg total) by mouth at bedtime. 15 tablet Georgetta Haber, NP     PDMP not reviewed this encounter.   Georgetta Haber, NP 10/06/19 [phone]

## 2019-10-13 ENCOUNTER — Telehealth: Payer: Self-pay

## 2019-10-13 NOTE — Telephone Encounter (Signed)
Called to confirm patients phone visit tomorrow 10/14/2019. Left voicemail

## 2019-10-14 ENCOUNTER — Encounter: Payer: Self-pay | Admitting: Internal Medicine

## 2019-10-14 ENCOUNTER — Ambulatory Visit (INDEPENDENT_AMBULATORY_CARE_PROVIDER_SITE_OTHER): Payer: BC Managed Care – PPO | Admitting: Internal Medicine

## 2019-10-14 DIAGNOSIS — M5440 Lumbago with sciatica, unspecified side: Secondary | ICD-10-CM

## 2019-10-14 MED ORDER — GABAPENTIN 100 MG PO CAPS: 100.0000 mg | ORAL_CAPSULE | Freq: Three times a day (TID) | ORAL | 3 refills | Status: AC

## 2019-10-14 NOTE — Progress Notes (Signed)
Virtual Visit via Telephone Note  I connected with [employer], on 10/14/2019 at 8:53 AM by telephone due to the COVID-19 pandemic and verified that I am speaking with the correct person using two identifiers.   Consent: I discussed the limitations, risks, security and privacy concerns of performing an evaluation and management service by telephone and the availability of in person appointments. I also discussed with the patient that there may be a patient responsible charge related to this service. The patient expressed understanding and agreed to proceed.   Location of Patient: Home   Location of Provider: Clinic    Persons participating in Telemedicine visit: Aino N Pennypacker Kierra [facility] [place] Dr. Earlene Plater      History of Present Illness: Patient has a visit to establish care. PMH significant for HTN, taking Amlodipine.   Worried about her back pain. It started on 2/g with gradual onset. Present all the time. She lifts boxes regularly at work but there was no specific injury she is aware of pain Was seen in ED on 2/7 for this and given Flexeril. She said all this medication does is make her fall asleep. Getting in the way of her picking up her children. Has been using her vacation time to stay at home out of work. No pain to thighs, no saddle symptoms, no numbness tingling or weakness to lower extremities. No loss of control of bowel or bladder.    Past Medical History:  Diagnosis Date  . Abnormal Pap smear    cryo, rpt wnl  . Chlamydia   . Gonorrhea   . Urinary tract infection    Allergies  Allergen Reactions  . Motrin [Ibuprofen] Other (See Comments)    Causes Bleeding.    Current Outpatient Medications on File Prior to Visit  Medication Sig Dispense Refill  . amLODipine (NORVASC) 5 MG tablet Take 1 tablet (5 mg total) by mouth daily. 90 tablet 3  . cyclobenzaprine (FLEXERIL) 5 MG tablet Take 1 tablet (5 mg total) by mouth at bedtime. 15 tablet 0   No  current facility-administered medications on file prior to visit.    Observations/Objective: NAD. Speaking clearly.  Work of breathing normal.  Alert and oriented. Mood appropriate.   Assessment and Plan: 1. Acute low back pain with sciatica, sciatica laterality unspecified, unspecified back pain laterality Sounds most consistent with sciatica. Patient had a positive straight leg test in the ER. Still considered acute onset as started 10 days ago. Not in range to obtain imaging yet. Will treat with neuropathic pain medication and have referred to PT.  - gabapentin (NEURONTIN) 100 MG capsule; Take 1 capsule (100 mg total) by mouth 3 (three) times daily.  Dispense: 90 capsule; Refill: 3 - Ambulatory referral to Physical Therapy   Follow Up Instructions: Follow up prn    I discussed the assessment and treatment plan with the patient. The patient was provided an opportunity to ask questions and all were answered. The patient agreed with the plan and demonstrated an understanding of the instructions.   The patient was advised to call back or seek an in-person evaluation if the symptoms worsen or if the condition fails to improve as anticipated.     I provided 12 minutes total of non-face-to-face time during this encounter including median intraservice time, reviewing previous notes, investigations, ordering medications, medical decision making, coordinating care and patient verbalized understanding at the end of the visit.    Marcy Siren, D.O. Primary Care at [facility]  10/14/2019, 8:53  AM

## 2019-10-20 ENCOUNTER — Ambulatory Visit: Payer: BC Managed Care – PPO | Attending: Internal Medicine | Admitting: Physical Therapy

## 2019-11-17 ENCOUNTER — Telehealth: Payer: Self-pay

## 2019-11-17 NOTE — Telephone Encounter (Signed)
Called patient to do their pre-visit COVID screening.  Call went to voicemail. Unable to do prescreening.  

## 2019-11-17 NOTE — Patient Instructions (Signed)

## 2019-11-19 ENCOUNTER — Ambulatory Visit (INDEPENDENT_AMBULATORY_CARE_PROVIDER_SITE_OTHER): Payer: BC Managed Care – PPO | Admitting: Internal Medicine

## 2019-11-19 ENCOUNTER — Other Ambulatory Visit: Payer: Self-pay

## 2019-11-19 ENCOUNTER — Encounter: Payer: Self-pay | Admitting: Internal Medicine

## 2019-11-19 ENCOUNTER — Other Ambulatory Visit (HOSPITAL_COMMUNITY)
Admission: RE | Admit: 2019-11-19 | Discharge: 2019-11-19 | Disposition: A | Discharge status: Home / Self Care | Payer: BC Managed Care – PPO | Source: Ambulatory Visit | Attending: Internal Medicine | Admitting: Internal Medicine

## 2019-11-19 VITALS — BP 137/90 | HR 70 | Temp 97.3°F | Resp 17 | Ht 66.0 in | Wt 169.0 lb

## 2019-11-19 DIAGNOSIS — Z131 Encounter for screening for diabetes mellitus: Secondary | ICD-10-CM | POA: Diagnosis present

## 2019-11-19 DIAGNOSIS — Z113 Encounter for screening for infections with a predominantly sexual mode of transmission: Secondary | ICD-10-CM

## 2019-11-19 DIAGNOSIS — Z1231 Encounter for screening mammogram for malignant neoplasm of breast: Secondary | ICD-10-CM

## 2019-11-19 DIAGNOSIS — Z124 Encounter for screening for malignant neoplasm of cervix: Secondary | ICD-10-CM

## 2019-11-19 DIAGNOSIS — R102 Pelvic and perineal pain unspecified side: Secondary | ICD-10-CM

## 2019-11-19 DIAGNOSIS — M5440 Lumbago with sciatica, unspecified side: Secondary | ICD-10-CM

## 2019-11-19 DIAGNOSIS — Z0000 Encounter for general adult medical examination without abnormal findings: Secondary | ICD-10-CM

## 2019-11-19 NOTE — Progress Notes (Signed)
  Subjective:    Melinda Simmons - 44 y.o. female MRN [id number]  Date of birth: 12-04-1975  HPI  Melinda Simmons is here for annual exam. Reports history of abnormal PAP when she was younger; needed cryotherapy x2. Requests STD screening. Contraception: s/p BTL.    Health Maintenance:  There are no preventive care reminders to display for this patient.  -  reports that she has been smoking cigarettes. She has a 2.50 pack-year smoking history. She has never used smokeless tobacco. - Review of Systems: Per HPI. - Past Medical History: There are no problems to display for this patient.  - Medications: reviewed and updated   Objective:   Physical Exam BP 137/90   Pulse 70   Temp (!) 97.3 F (36.3 C) (Temporal)   Resp 17   Ht 5\' 6"  (1.676 m)   Wt 169 lb (76.7 kg)   LMP 10/29/2019 (Within Days)   SpO2 98%   BMI 27.28 kg/m  Physical Exam  Constitutional: She is oriented to person, place, and time and well-developed, well-nourished, and in no distress.  HENT:  Head: Normocephalic and atraumatic.  Mouth/Throat: Oropharynx is clear and moist.  Eyes: Pupils are equal, round, and reactive to light. Conjunctivae and EOM are normal.  Neck: No thyromegaly present.  Cardiovascular: Normal rate, regular rhythm, normal heart sounds and intact distal pulses.  No murmur heard. Pulmonary/Chest: Effort normal and breath sounds normal. No respiratory distress. She has no wheezes.  Abdominal: Soft. Bowel sounds are normal. She exhibits no distension. There is no abdominal tenderness. There is no rebound and no guarding.  Genitourinary:    Genitourinary Comments: Exam performed in the presence of a chaperone. External genitalia within normal limits.  Vaginal mucosa pink, moist, normal rugae.  Nonfriable cervix without lesions, no discharge or bleeding noted on speculum exam.  Bimanual exam revealed normal, nongravid uterus.  No cervical motion tenderness. Adnexal tenderness on left side;  feels slightly more full on that side.    Musculoskeletal:        General: No deformity or edema. Normal range of motion.     Cervical back: Normal range of motion and neck supple.  Lymphadenopathy:    She has no cervical adenopathy.  Neurological: She is alert and oriented to person, place, and time. Gait normal.  Skin: Skin is warm and dry. No rash noted. She is not diaphoretic.  Psychiatric: Mood, affect and judgment normal.           Assessment & Plan:   1. Annual physical exam - CBC with Differential - Comprehensive metabolic panel - Lipid Panel  2. Pap smear for cervical cancer screening - Cytology - PAP(Kathleen)  3. Breast cancer screening by mammogram - MM Digital Screening; Future  4. Screening for STDs (sexually transmitted diseases) - Cervicovaginal ancillary only - RPR  5. Adnexal tenderness Patient has adnexal tenderness and questionable fullness on left side. Suspect ovarian cyst as most likely etiology as she has also been having intermittent lower abdominal pain. Patient elected to have ultrasound imaging done.  - 12/29/2019 PELVIC COMPLETE WITH TRANSVAGINAL; Future  6. Acute low back pain with sciatica, sciatica laterality unspecified, unspecified back pain laterality This has mostly resolved. Gabapentin has been helpful with pain management. She would like letter to return to work without restrictions--this was provided.      [place], D.O. 11/19/2019, 10:28 AM Primary Care at [facility]

## 2019-11-20 LAB — COMPREHENSIVE METABOLIC PANEL
ALT: 8 IU/L (ref 0–32)
AST: 11 IU/L (ref 0–40)
Albumin/Globulin Ratio: 1.6 (ref 1.2–2.2)
Albumin: 4.6 g/dL (ref 3.8–4.8)
Alkaline Phosphatase: 68 IU/L (ref 39–117)
BUN/Creatinine Ratio: 8 — ABNORMAL LOW (ref 9–23)
BUN: 7 mg/dL (ref 6–24)
Bilirubin Total: <0.2 mg/dL (ref 0.0–1.2)
CO2: 22 mmol/L (ref 20–29)
Calcium: 9.5 mg/dL (ref 8.7–10.2)
Chloride: 104 mmol/L (ref 96–106)
Creatinine, Ser: 0.92 mg/dL (ref 0.57–1.00)
GFR calc Af Amer: 89 mL/min/{1.73_m2} (ref >59)
GFR calc non Af Amer: 78 mL/min/{1.73_m2} (ref >59)
Globulin, Total: 2.9 g/dL (ref 1.5–4.5)
Glucose: 101 mg/dL — ABNORMAL HIGH (ref 65–99)
Potassium: 4 mmol/L (ref 3.5–5.2)
Sodium: 140 mmol/L (ref 134–144)
Total Protein: 7.5 g/dL (ref 6.0–8.5)

## 2019-11-20 LAB — CBC WITH DIFFERENTIAL/PLATELET
Basophils Absolute: 0 10*3/uL (ref 0.0–0.2)
Basos: 1 %
EOS (ABSOLUTE): 0.1 10*3/uL (ref 0.0–0.4)
Eos: 2 %
Hematocrit: 40.9 % (ref 34.0–46.6)
Hemoglobin: 13.5 g/dL (ref 11.1–15.9)
Immature Grans (Abs): 0 10*3/uL (ref 0.0–0.1)
Immature Granulocytes: 0 %
Lymphocytes Absolute: 2.4 10*3/uL (ref 0.7–3.1)
Lymphs: 32 %
MCH: 28.7 pg (ref 26.6–33.0)
MCHC: 33 g/dL (ref 31.5–35.7)
MCV: 87 fL (ref 79–97)
Monocytes Absolute: 0.4 10*3/uL (ref 0.1–0.9)
Monocytes: 6 %
Neutrophils Absolute: 4.4 10*3/uL (ref 1.4–7.0)
Neutrophils: 59 %
Platelets: 263 10*3/uL (ref 150–450)
RBC: 4.7 x10E6/uL (ref 3.77–5.28)
RDW: 14.7 % (ref 11.7–15.4)
WBC: 7.4 10*3/uL (ref 3.4–10.8)

## 2019-11-20 LAB — CERVICOVAGINAL ANCILLARY ONLY
Bacterial Vaginitis (gardnerella): POSITIVE — AB
Candida Glabrata: NEGATIVE
Candida Vaginitis: NEGATIVE
Chlamydia: NEGATIVE
Comment: NEGATIVE
Comment: NEGATIVE
Comment: NEGATIVE
Comment: NEGATIVE
Comment: NEGATIVE
Comment: NORMAL
Neisseria Gonorrhea: NEGATIVE
Trichomonas: POSITIVE — AB

## 2019-11-20 LAB — LIPID PANEL
Chol/HDL Ratio: 3.9 ratio (ref 0.0–4.4)
Cholesterol, Total: 209 mg/dL — ABNORMAL HIGH (ref 100–199)
HDL: 53 mg/dL (ref >39)
LDL Chol Calc (NIH): 135 mg/dL — ABNORMAL HIGH (ref 0–99)
Triglycerides: 117 mg/dL (ref 0–149)
VLDL Cholesterol Cal: 21 mg/dL (ref 5–40)

## 2019-11-20 LAB — CYTOLOGY - PAP
Comment: NEGATIVE
High risk HPV: NEGATIVE

## 2019-11-20 LAB — RPR: RPR Ser Ql: NONREACTIVE

## 2019-11-21 ENCOUNTER — Telehealth: Payer: Self-pay

## 2019-11-21 ENCOUNTER — Encounter: Payer: Self-pay | Admitting: Internal Medicine

## 2019-11-21 ENCOUNTER — Other Ambulatory Visit: Payer: Self-pay | Admitting: Internal Medicine

## 2019-11-21 DIAGNOSIS — R87612 Low grade squamous intraepithelial lesion on cytologic smear of cervix (LGSIL): Secondary | ICD-10-CM | POA: Insufficient documentation

## 2019-11-21 MED ORDER — METRONIDAZOLE 500 MG PO TABS: 500.0000 mg | ORAL_TABLET | Freq: Two times a day (BID) | ORAL | 0 refills | Status: AC

## 2019-11-21 NOTE — Progress Notes (Signed)
Patient notified of results & recommendations. Expressed understanding.

## 2019-11-21 NOTE — Telephone Encounter (Signed)
Patient was in office earlier this week & provider ordered imaging. I am unable to acquire a prior authorization for patient's ultrasound due her current insurance card not being scanned in to her chart.  I called the number that should've been for the prior auth department with [facility] & they were unable to pull up patient by member ID listed under coverage.  If you could contact patient to get front & back of insurance card so that I can get the PA approved & [place] scheduled. Thanks.

## 2019-11-24 NOTE — Telephone Encounter (Signed)
Patient's insurance card is still needed for me to do the PA for her imaging.

## 2019-12-03 ENCOUNTER — Ambulatory Visit: Payer: BC Managed Care – PPO

## 2019-12-22 ENCOUNTER — Ambulatory Visit (HOSPITAL_COMMUNITY)
Admission: RE | Admit: 2019-12-22 | Discharge: 2019-12-22 | Disposition: A | Discharge status: Home / Self Care | Payer: BC Managed Care – PPO | Source: Ambulatory Visit | Attending: Internal Medicine | Admitting: Internal Medicine

## 2019-12-22 ENCOUNTER — Other Ambulatory Visit: Payer: Self-pay

## 2019-12-22 DIAGNOSIS — R102 Pelvic and perineal pain: Secondary | ICD-10-CM | POA: Diagnosis not present

## 2019-12-23 ENCOUNTER — Telehealth: Payer: BC Managed Care – PPO | Admitting: Internal Medicine

## 2020-11-22 ENCOUNTER — Telehealth: Payer: Self-pay | Admitting: *Deleted

## 2020-11-22 NOTE — Telephone Encounter (Signed)
Transition Care Management Follow-up Telephone Call  Date of discharge and from where: 11/19/2020 - [facility]  How have you been since you were released from the hospital? "I am doing okay"  Any questions or concerns? No  Items Reviewed:  Did the pt receive and understand the discharge instructions provided? Yes   Medications obtained and verified? Yes   Other? No   Any new allergies since your discharge? No   Dietary orders reviewed? No  Do you have support at home? No  - lives alone with her children  Home Care and Equipment/Supplies: Were home health services ordered? not applicable If so, what is the name of the agency? N/A  Has the agency set up a time to come to the patient's home? not applicable Were any new equipment or medical supplies ordered?  No What is the name of the medical supply agency? N/A Were you able to get the supplies/equipment? not applicable Do you have any questions related to the use of the equipment or supplies? No  Functional Questionnaire: (I = Independent and D = Dependent) ADLs: I  Bathing/Dressing- I  Meal Prep- I  Eating- I  Maintaining continence- I  Transferring/Ambulation- I  Managing Meds- I  Follow up appointments reviewed:   PCP Hospital f/u appt confirmed? No    Specialist Hospital f/u appt confirmed? No    Are transportation arrangements needed? No   If their condition worsens, is the pt aware to call PCP or go to the Emergency Dept.? Yes  Was the patient provided with contact information for the PCP's office or ED? Yes  Was to pt encouraged to call back with questions or concerns? Yes

## 2020-12-05 IMAGING — US US PELVIS COMPLETE WITH TRANSVAGINAL
1 series · 15 of 25 positions shown · non-contrast
Comparison: None

CLINICAL DATA: Adnexal tenderness on LEFT with fullness, LMP =
12/02/2019

EXAM:
TRANSABDOMINAL AND TRANSVAGINAL ULTRASOUND OF PELVIS
TECHNIQUE: Both transabdominal and transvaginal ultrasound examinations of the
pelvis were performed. Transabdominal technique was performed for
global imaging of the pelvis including uterus, ovaries, adnexal
regions, and pelvic cul-de-sac. It was necessary to proceed with
endovaginal exam following the transabdominal exam to visualize the
RIGHT ovary.

[Series 1: us pelvis complete with transvaginal · 15 of 81 slices shown]
[im 1/81]
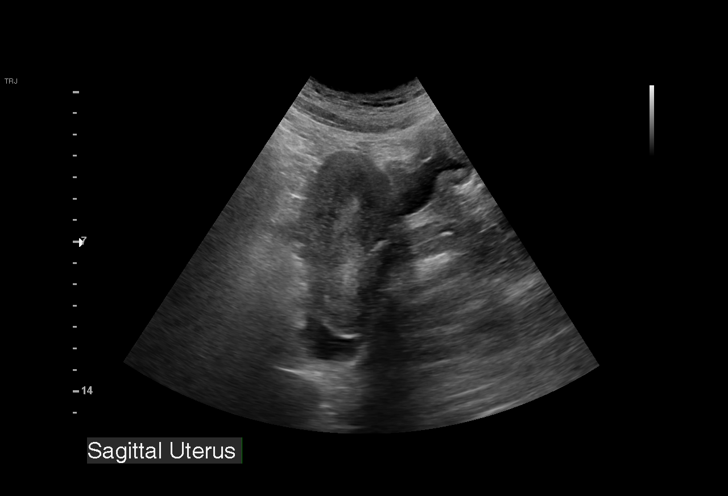
[im 7/81]
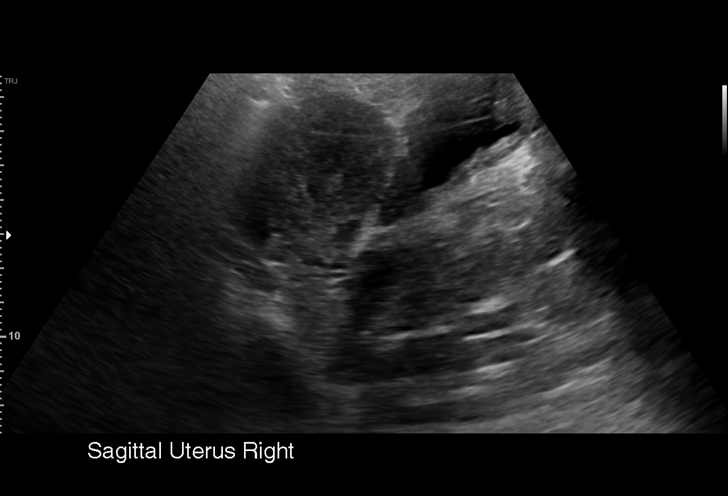
[im 14/81]
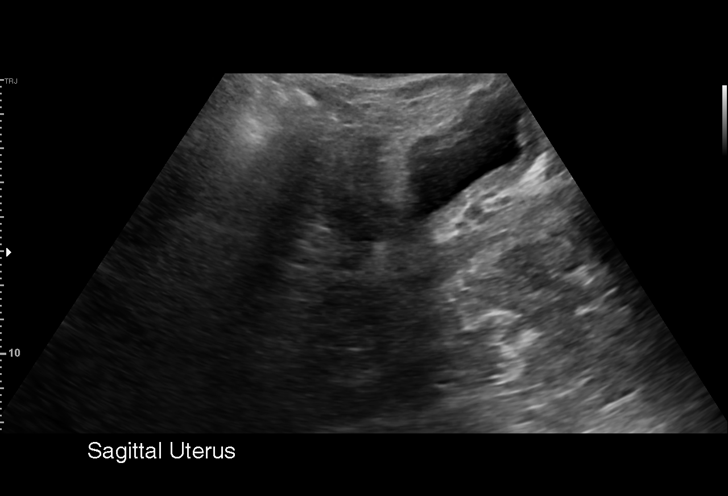
[im 17/81]
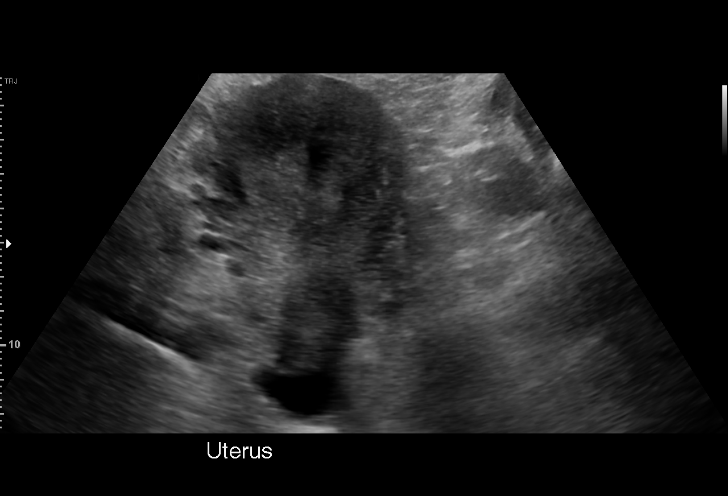
[im 24/81]
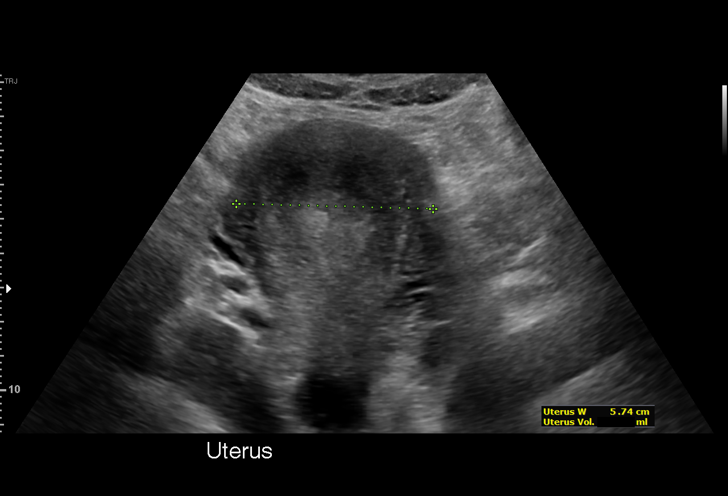
[im 31/81]
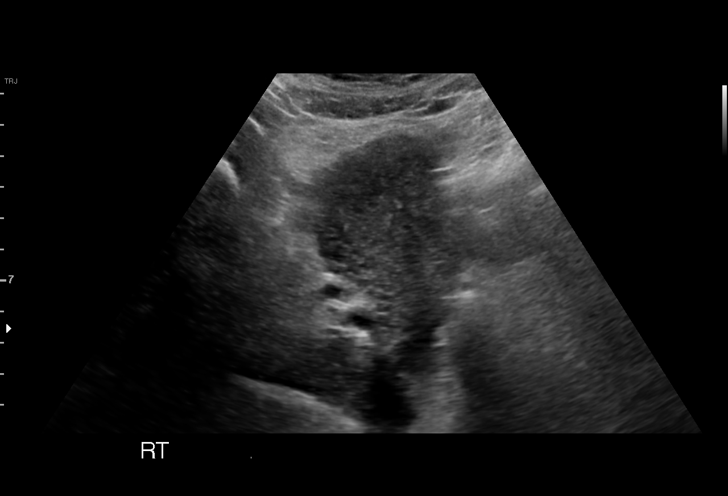
[im 34/81]
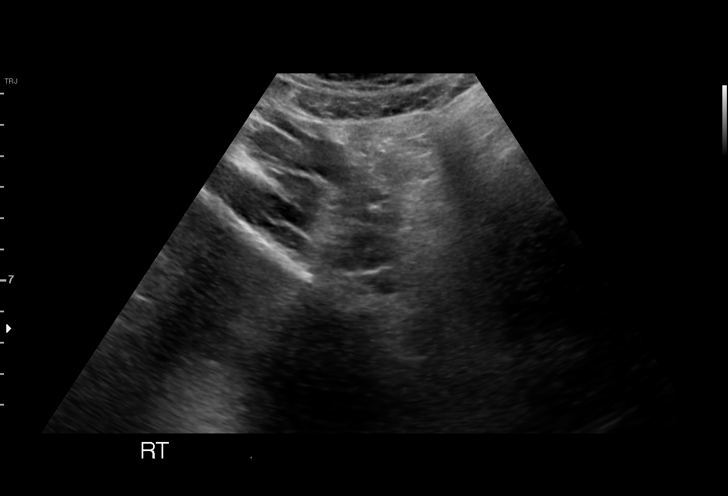
[im 41/81]
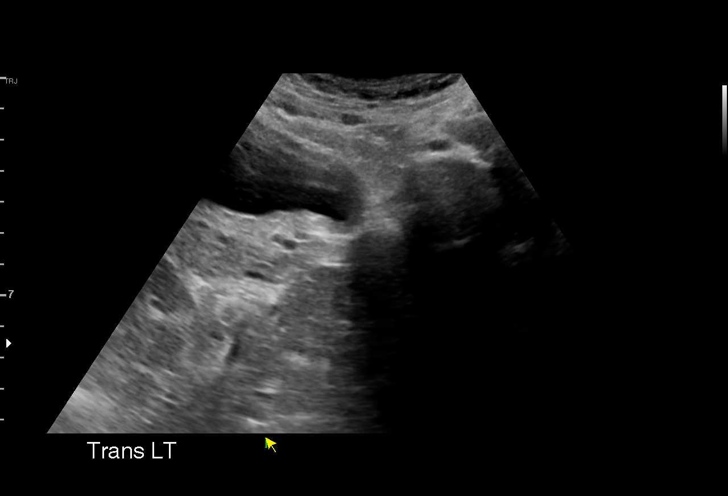
[im 47/81]
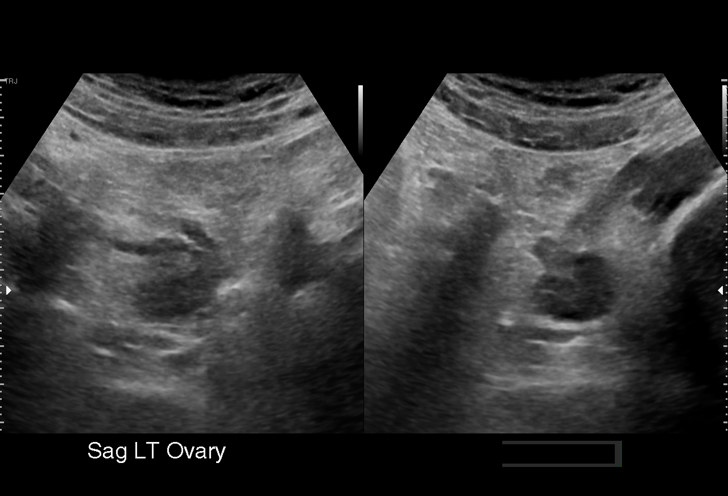
[im 51/81]
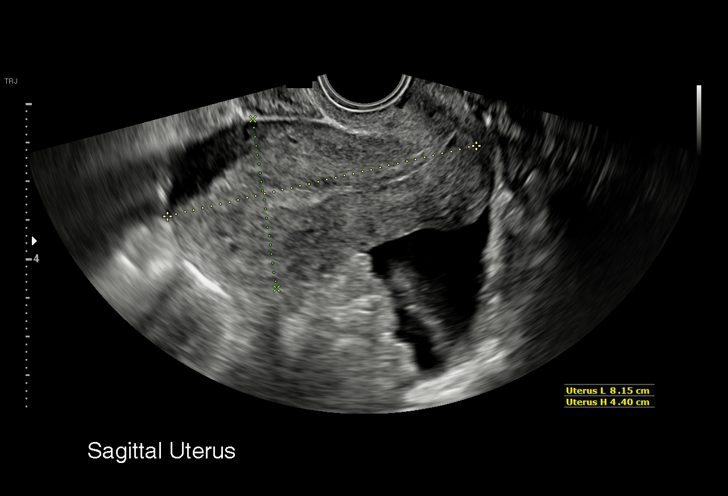
[im 57/81]
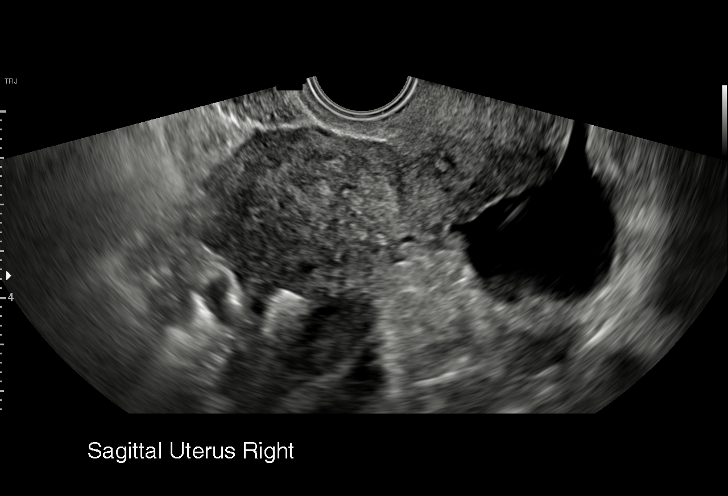
[im 64/81]
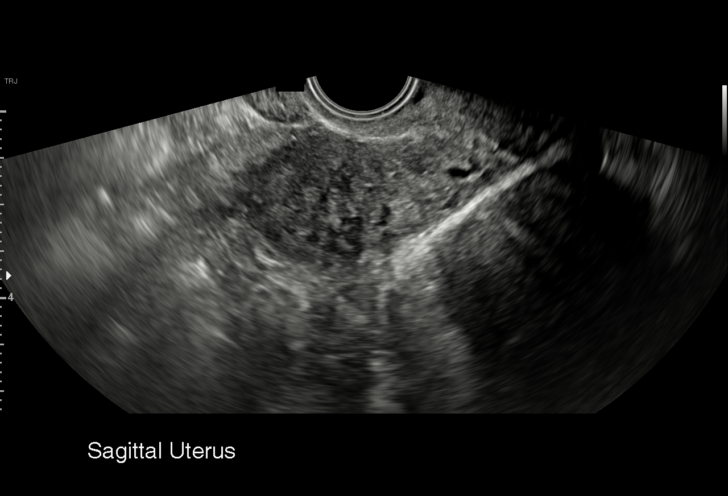
[im 67/81]
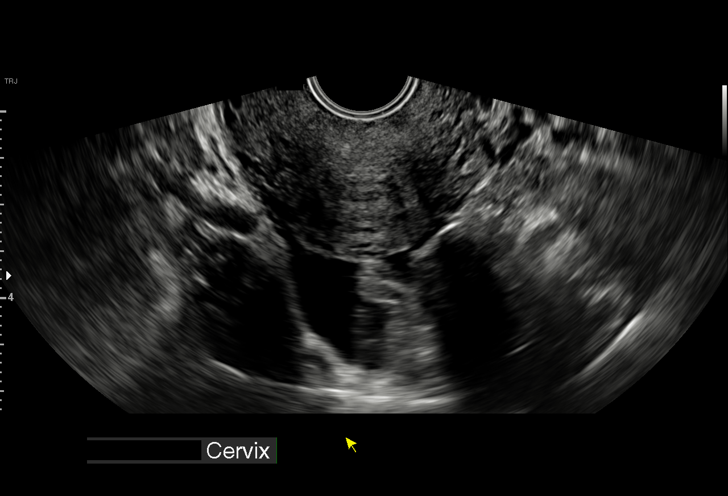
[im 74/81]
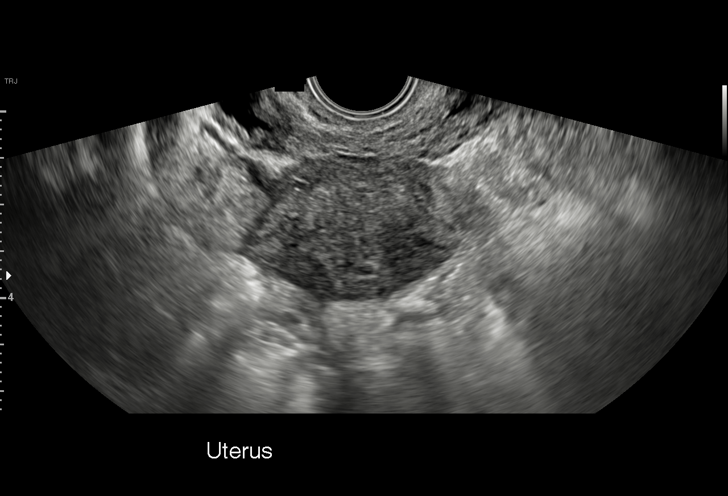
[im 81/81]
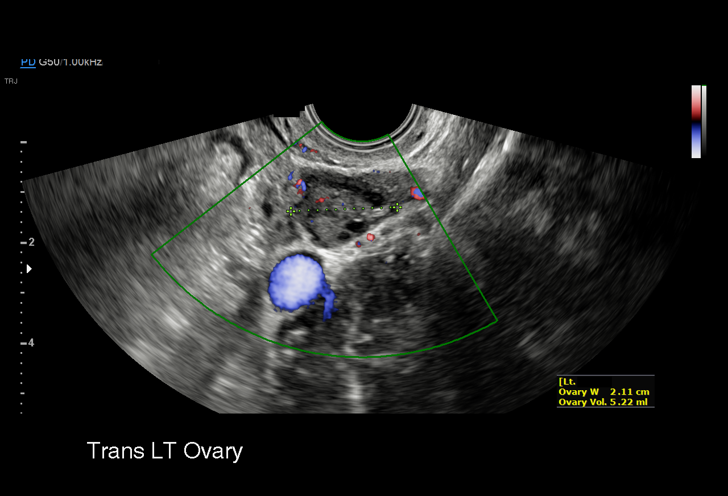

[15 of 25 positions shown; findings below may reference images not displayed]

FINDINGS: Uterus

Measurements: 8.3 x 4.0 x 5.7 cm = volume: 100 mL. Anteverted.
Normal morphology without mass

Endometrium

Thickness: 11 mm.  No endometrial fluid or focal abnormality

Right ovary

Measurements: 3.5 x 1.7 x 1.6 cm = volume: 5.1 mL. Small corpus
luteum without additional mass.

Left ovary

Measurements: 3.0 x 1.6 x 2.1 cm = volume: 5.2 mL. Normal morphology
without mass

Other findings

Small amount of nonspecific simple appearing free pelvic fluid. No
adnexal masses.
IMPRESSION: Small amount of nonspecific free pelvic fluid.

Otherwise normal exam.
